# Patient Record
Sex: Male | Born: 1980 | Race: Black or African American | Hispanic: No | Marital: Single | Smoking: Former smoker
Health system: Southern US, Community
[De-identification: ages and names within clinical notes are randomized; demographics above are authoritative.]

## PROBLEM LIST (undated history)

## (undated) DIAGNOSIS — S43006A Unspecified dislocation of unspecified shoulder joint, initial encounter: Secondary | ICD-10-CM

## (undated) DIAGNOSIS — Z789 Other specified health status: Secondary | ICD-10-CM

## (undated) HISTORY — PX: ROTATOR CUFF REPAIR: SHX139

## (undated) HISTORY — PX: MANDIBLE FRACTURE SURGERY: SHX706

---

## 2005-09-08 ENCOUNTER — Emergency Department (HOSPITAL_COMMUNITY): Admission: EM | Admit: 2005-09-08 | Discharge: 2005-09-09 | Payer: Self-pay | Admitting: Emergency Medicine

## 2005-09-21 ENCOUNTER — Emergency Department (HOSPITAL_COMMUNITY): Admission: EM | Admit: 2005-09-21 | Discharge: 2005-09-21 | Payer: Self-pay | Admitting: Emergency Medicine

## 2005-10-22 ENCOUNTER — Emergency Department (HOSPITAL_COMMUNITY): Admission: EM | Admit: 2005-10-22 | Discharge: 2005-10-22 | Payer: Self-pay | Admitting: Family Medicine

## 2008-08-01 ENCOUNTER — Emergency Department (HOSPITAL_COMMUNITY): Admission: EM | Admit: 2008-08-01 | Discharge: 2008-08-01 | Payer: Self-pay | Admitting: Emergency Medicine

## 2009-04-28 ENCOUNTER — Emergency Department (HOSPITAL_COMMUNITY): Admission: EM | Admit: 2009-04-28 | Discharge: 2009-04-28 | Payer: Self-pay | Admitting: Emergency Medicine

## 2013-05-25 ENCOUNTER — Emergency Department (HOSPITAL_COMMUNITY)
Admission: EM | Admit: 2013-05-25 | Discharge: 2013-05-25 | Disposition: A | Discharge status: Home / Self Care | Payer: Self-pay | Attending: Emergency Medicine | Admitting: Emergency Medicine

## 2013-05-25 ENCOUNTER — Encounter (HOSPITAL_COMMUNITY): Payer: Self-pay | Admitting: Emergency Medicine

## 2013-05-25 DIAGNOSIS — R143 Flatulence: Secondary | ICD-10-CM

## 2013-05-25 DIAGNOSIS — R142 Eructation: Secondary | ICD-10-CM | POA: Insufficient documentation

## 2013-05-25 DIAGNOSIS — R141 Gas pain: Secondary | ICD-10-CM | POA: Insufficient documentation

## 2013-05-25 DIAGNOSIS — R197 Diarrhea, unspecified: Secondary | ICD-10-CM | POA: Insufficient documentation

## 2013-05-25 DIAGNOSIS — R109 Unspecified abdominal pain: Secondary | ICD-10-CM

## 2013-05-25 DIAGNOSIS — R1084 Generalized abdominal pain: Secondary | ICD-10-CM | POA: Insufficient documentation

## 2013-05-25 DIAGNOSIS — Z79899 Other long term (current) drug therapy: Secondary | ICD-10-CM | POA: Insufficient documentation

## 2013-05-25 LAB — URINALYSIS, ROUTINE W REFLEX MICROSCOPIC
BILIRUBIN URINE: NEGATIVE
Glucose, UA: NEGATIVE mg/dL
HGB URINE DIPSTICK: NEGATIVE
Ketones, ur: NEGATIVE mg/dL
LEUKOCYTES UA: NEGATIVE
NITRITE: NEGATIVE
PH: 7.5 (ref 5.0–8.0)
PROTEIN: NEGATIVE mg/dL
Specific Gravity, Urine: 1.011 (ref 1.005–1.030)
UROBILINOGEN UA: 0.2 mg/dL (ref 0.0–1.0)

## 2013-05-25 LAB — COMPREHENSIVE METABOLIC PANEL
ALBUMIN: 4.3 g/dL (ref 3.5–5.2)
ALK PHOS: 65 U/L (ref 39–117)
ALT: 22 U/L (ref 0–53)
AST: 20 U/L (ref 0–37)
BUN: 10 mg/dL (ref 6–23)
CALCIUM: 9.6 mg/dL (ref 8.4–10.5)
CO2: 26 meq/L (ref 19–32)
CREATININE: 0.83 mg/dL (ref 0.50–1.35)
Chloride: 101 mEq/L (ref 96–112)
GLUCOSE: 92 mg/dL (ref 70–99)
Potassium: 4 mEq/L (ref 3.7–5.3)
Sodium: 141 mEq/L (ref 137–147)
TOTAL PROTEIN: 8.1 g/dL (ref 6.0–8.3)
Total Bilirubin: 0.5 mg/dL (ref 0.3–1.2)

## 2013-05-25 LAB — CBC WITH DIFFERENTIAL/PLATELET
Basophils Absolute: 0.1 10*3/uL (ref 0.0–0.1)
Basophils Relative: 2 % — ABNORMAL HIGH (ref 0–1)
Eosinophils Absolute: 0 10*3/uL (ref 0.0–0.7)
Eosinophils Relative: 1 % (ref 0–5)
HCT: 43.6 % (ref 39.0–52.0)
Hemoglobin: 15 g/dL (ref 13.0–17.0)
LYMPHS PCT: 45 % (ref 12–46)
Lymphs Abs: 1.7 10*3/uL (ref 0.7–4.0)
MCH: 28.1 pg (ref 26.0–34.0)
MCHC: 34.4 g/dL (ref 30.0–36.0)
MCV: 81.8 fL (ref 78.0–100.0)
Monocytes Absolute: 0.3 10*3/uL (ref 0.1–1.0)
Monocytes Relative: 8 % (ref 3–12)
NEUTROS ABS: 1.7 10*3/uL (ref 1.7–7.7)
Neutrophils Relative %: 45 % (ref 43–77)
Platelets: 188 10*3/uL (ref 150–400)
RBC: 5.33 MIL/uL (ref 4.22–5.81)
RDW: 13 % (ref 11.5–15.5)
WBC: 3.7 10*3/uL — AB (ref 4.0–10.5)

## 2013-05-25 LAB — LIPASE, BLOOD: LIPASE: 22 U/L (ref 11–59)

## 2013-05-25 MED ORDER — DICYCLOMINE HCL 20 MG PO TABS: 20.0000 mg | ORAL_TABLET | Freq: Two times a day (BID) | ORAL | Status: DC

## 2013-05-25 NOTE — ED Provider Notes (Signed)
CSN: [id number]     Arrival date & time 05/25/13  1257 History   First MD Initiated Contact with Patient 05/25/13 1412     Chief Complaint  Patient presents with  . Abdominal Pain   (Consider location/radiation/quality/duration/timing/severity/associated sxs/prior Treatment) Patient is a 33 y.o. male presenting with abdominal pain. The history is provided by the patient. No language interpreter was used.  Abdominal Pain Pain location:  Generalized Associated symptoms: diarrhea   Associated symptoms: no chills, no fever, no nausea and no vomiting   Associated symptoms comment:  Mild abdominal pain that fluctuates, causes abdominal bloating and increased flatulence. He reports his stools have been watery without blood or melena. No nausea or vomiting. He denies any significant dietary changes in the last 5 months since his symptoms began.   History reviewed. No pertinent past medical history. History reviewed. No pertinent past surgical history. History reviewed. No pertinent family history. History  Substance Use Topics  . Smoking status: Not on file  . Smokeless tobacco: Not on file  . Alcohol Use: No    Review of Systems  Constitutional: Negative for fever and chills.  Respiratory: Negative.   Cardiovascular: Negative.   Gastrointestinal: Positive for abdominal pain and diarrhea. Negative for nausea, vomiting and blood in stool.  Genitourinary: Negative.   Musculoskeletal: Negative.   Skin: Negative.   Neurological: Negative.     Allergies  Shrimp  Home Medications   Current Outpatient Rx  Name  Route  Sig  Dispense  Refill  . dicyclomine (BENTYL) 20 MG tablet   Oral   Take 1 tablet (20 mg total) by mouth 2 (two) times daily.   20 tablet   0    BP 175/65  Pulse 54  Temp(Src) 97.8 F (36.6 C) (Oral)  Resp 18  Wt 192 lb 4 oz (87.204 kg)  SpO2 100% Physical Exam  Constitutional: He is oriented to person, place, and time. He appears well-developed and  well-nourished.  HENT:  Head: Normocephalic.  Neck: Normal range of motion. Neck supple.  Cardiovascular: Normal rate and regular rhythm.   Pulmonary/Chest: Effort normal and breath sounds normal.  Abdominal: Soft. Bowel sounds are normal. There is tenderness. There is no rebound and no guarding.  Very mild, diffuse tenderness.   Musculoskeletal: Normal range of motion.  Neurological: He is alert and oriented to person, place, and time.  Skin: Skin is warm and dry. No rash noted.  Psychiatric: He has a normal mood and affect.    ED Course  Procedures (including critical care time) Labs Review Labs Reviewed  CBC WITH DIFFERENTIAL - Abnormal; Notable for the following:    WBC 3.7 (*)    Basophils Relative 2 (*)    All other components within normal limits  COMPREHENSIVE METABOLIC PANEL  LIPASE, BLOOD  URINALYSIS, ROUTINE W REFLEX MICROSCOPIC   Results for orders placed during the hospital encounter of 05/25/13  CBC WITH DIFFERENTIAL      Result Value Range   WBC 3.7 (*) 4.0 - 10.5 K/uL   RBC 5.33  4.22 - 5.81 MIL/uL   Hemoglobin 15.0  13.0 - 17.0 g/dL   HCT 40.9  81.1 - 91.4 %   MCV 81.8  78.0 - 100.0 fL   MCH 28.1  26.0 - 34.0 pg   MCHC 34.4  30.0 - 36.0 g/dL   RDW 78.2  95.6 - 21.3 %   Platelets 188  150 - 400 K/uL   Neutrophils Relative % 45  43 -  77 %   Neutro Abs 1.7  1.7 - 7.7 K/uL   Lymphocytes Relative 45  12 - 46 %   Lymphs Abs 1.7  0.7 - 4.0 K/uL   Monocytes Relative 8  3 - 12 %   Monocytes Absolute 0.3  0.1 - 1.0 K/uL   Eosinophils Relative 1  0 - 5 %   Eosinophils Absolute 0.0  0.0 - 0.7 K/uL   Basophils Relative 2 (*) 0 - 1 %   Basophils Absolute 0.1  0.0 - 0.1 K/uL  COMPREHENSIVE METABOLIC PANEL      Result Value Range   Sodium 141  137 - 147 mEq/L   Potassium 4.0  3.7 - 5.3 mEq/L   Chloride 101  96 - 112 mEq/L   CO2 26  19 - 32 mEq/L   Glucose, Bld 92  70 - 99 mg/dL   BUN 10  6 - 23 mg/dL   Creatinine, Ser 1.610.83  0.50 - 1.35 mg/dL   Calcium 9.6   8.4 - 09.610.5 mg/dL   Total Protein 8.1  6.0 - 8.3 g/dL   Albumin 4.3  3.5 - 5.2 g/dL   AST 20  0 - 37 U/L   ALT 22  0 - 53 U/L   Alkaline Phosphatase 65  39 - 117 U/L   Total Bilirubin 0.5  0.3 - 1.2 mg/dL   GFR calc non Af Amer >90  >90 mL/min   GFR calc Af Amer >90  >90 mL/min  LIPASE, BLOOD      Result Value Range   Lipase 22  11 - 59 U/L  URINALYSIS, ROUTINE W REFLEX MICROSCOPIC      Result Value Range   Color, Urine YELLOW  YELLOW   APPearance CLEAR  CLEAR   Specific Gravity, Urine 1.011  1.005 - 1.030   pH 7.5  5.0 - 8.0   Glucose, UA NEGATIVE  NEGATIVE mg/dL   Hgb urine dipstick NEGATIVE  NEGATIVE   Bilirubin Urine NEGATIVE  NEGATIVE   Ketones, ur NEGATIVE  NEGATIVE mg/dL   Protein, ur NEGATIVE  NEGATIVE mg/dL   Urobilinogen, UA 0.2  0.0 - 1.0 mg/dL   Nitrite NEGATIVE  NEGATIVE   Leukocytes, UA NEGATIVE  NEGATIVE    Imaging Review No results found.  EKG Interpretation   None       MDM   1. Abdominal pain    Symptoms for 5 months, benign exam and normal blood studies are reassuring that there is no acute process. Discussed medication that might help symptoms and importance of primary care.     Arnoldo HookerShari A Shirley Bolle, PA-C 05/25/13 1508

## 2013-05-25 NOTE — Discharge Instructions (Signed)
FOLLOW UP WITH YOUR CHOICE OF PRIMARY CARE PROVIDER. TAKE MEDICATION AS PRESCRIBED. RETURN HERE WITH ANY SEVERE PAIN, HIGH FEVER, VOMITING OR BLOODY BOWEL MOVEMENT.

## 2013-05-25 NOTE — ED Notes (Signed)
Per patient request. Called [phone] to let them know that pt is in our ER. No medical information given.

## 2013-05-25 NOTE — ED Notes (Signed)
Pt reports having abd pain, cramping, bloating, intermittent diarrhea. No vomiting. No acute distress noted at triage.

## 2013-05-25 NOTE — ED Provider Notes (Signed)
Medical screening examination/treatment/procedure(s) were performed by non-physician practitioner and as supervising physician I was immediately available for consultation/collaboration.  Flint MelterElliott L Bleu Minerd, MD 05/25/13 2013

## 2013-11-20 ENCOUNTER — Emergency Department (HOSPITAL_COMMUNITY): Payer: Self-pay

## 2013-11-20 ENCOUNTER — Emergency Department (HOSPITAL_COMMUNITY)
Admission: EM | Admit: 2013-11-20 | Discharge: 2013-11-21 | Disposition: A | Discharge status: Home / Self Care | Payer: Self-pay | Attending: Emergency Medicine | Admitting: Emergency Medicine

## 2013-11-20 ENCOUNTER — Encounter (HOSPITAL_COMMUNITY): Payer: Self-pay | Admitting: Emergency Medicine

## 2013-11-20 DIAGNOSIS — S43004A Unspecified dislocation of right shoulder joint, initial encounter: Secondary | ICD-10-CM

## 2013-11-20 DIAGNOSIS — Y9367 Activity, basketball: Secondary | ICD-10-CM | POA: Insufficient documentation

## 2013-11-20 DIAGNOSIS — S43016A Anterior dislocation of unspecified humerus, initial encounter: Secondary | ICD-10-CM | POA: Insufficient documentation

## 2013-11-20 DIAGNOSIS — Y92838 Other recreation area as the place of occurrence of the external cause: Secondary | ICD-10-CM

## 2013-11-20 DIAGNOSIS — Y9239 Other specified sports and athletic area as the place of occurrence of the external cause: Secondary | ICD-10-CM | POA: Insufficient documentation

## 2013-11-20 DIAGNOSIS — X58XXXA Exposure to other specified factors, initial encounter: Secondary | ICD-10-CM | POA: Insufficient documentation

## 2013-11-20 DIAGNOSIS — F17200 Nicotine dependence, unspecified, uncomplicated: Secondary | ICD-10-CM | POA: Insufficient documentation

## 2013-11-20 HISTORY — DX: Unspecified dislocation of unspecified shoulder joint, initial encounter: S43.006A

## 2013-11-20 MED ORDER — OXYCODONE-ACETAMINOPHEN 5-325 MG PO TABS
2.0000 | ORAL_TABLET | Freq: Once | ORAL | Status: AC
  Administered 2013-11-20: 2 via ORAL
  Filled 2013-11-20: qty 2

## 2013-11-20 MED ORDER — HYDROMORPHONE HCL PF 1 MG/ML IJ SOLN
1.0000 mg | INTRAMUSCULAR | Status: DC | PRN
  Administered 2013-11-20: 1 mg via INTRAVENOUS
  Filled 2013-11-20: qty 1

## 2013-11-20 MED ORDER — HYDROMORPHONE HCL PF 1 MG/ML IJ SOLN
1.0000 mg | Freq: Once | INTRAMUSCULAR | Status: AC
  Administered 2013-11-20: 1 mg via INTRAVENOUS
  Filled 2013-11-20: qty 1

## 2013-11-20 MED ORDER — PROPOFOL 10 MG/ML IV BOLUS
0.5000 mg/kg | Freq: Once | INTRAVENOUS | Status: AC
  Administered 2013-11-20: 200 mg via INTRAVENOUS
  Filled 2013-11-20: qty 20

## 2013-11-20 MED ORDER — PROPOFOL 10 MG/ML IV BOLUS
INTRAVENOUS | Status: AC
  Filled 2013-11-20: qty 20

## 2013-11-20 NOTE — Sedation Documentation (Signed)
Propofol 30mg  IV given by MD  Pt remains awake.

## 2013-11-20 NOTE — Sedation Documentation (Signed)
Propofol 100mg  IV given by Res.

## 2013-11-20 NOTE — Discharge Instructions (Signed)
Shoulder Dislocation °Your shoulder is made up of three bones: the collar bone (clavicle); the shoulder blade (scapula), which includes the socket (glenoid cavity); and the upper arm bone (humerus). Your shoulder joint is the place where these bones meet. Strong, fibrous tissues hold these bones together (ligaments). Muscles and strong, fibrous tissues that connect the muscles to these bones (tendons) allow your arm to move through this joint. The range of motion of your shoulder joint is more extensive than most of your other joints, and the glenoid cavity is very shallow. That is the reason that your shoulder joint is one of the most unstable joints in your body. It is far more prone to dislocation than your other joints. Shoulder dislocation is when your humerus is forced out of your shoulder joint. °CAUSES °Shoulder dislocation is caused by a forceful impact on your shoulder. This impact usually is from an injury, such as a sports injury or a fall. °SYMPTOMS °Symptoms of shoulder dislocation include: °· Deformity of your shoulder. °· Intense pain. °· Inability to move your shoulder joint. °· Numbness, weakness, or tingling around your shoulder joint (your neck or down your arm). °· Bruising or swelling around your shoulder. °DIAGNOSIS °In order to diagnose a dislocated shoulder, your caregiver will perform a physical exam. Your caregiver also may have an X-ray exam done to see if you have any broken bones. Magnetic resonance imaging (MRI) is a procedure that sometimes is done to help your caregiver see any damage to the soft tissues around your shoulder, particularly your rotator cuff tendons. Additionally, your caregiver also may have electromyography done to measure the electrical discharges produced in your muscles if you have signs or symptoms of nerve damage. °TREATMENT °A shoulder dislocation is treated by placing the humerus back in the joint (reduction). Your caregiver does this either manually (closed  reduction), by moving your humerus back into the joint through manipulation, or through surgery (open reduction). When your humerus is back in place, severe pain should improve almost immediately. °You also may need to have surgery if you have a weak shoulder joint or ligaments, and you have recurring shoulder dislocations, despite rehabilitation. In rare cases, surgery is necessary if your nerves or blood vessels are damaged during the dislocation. °After your reduction, your arm will be placed in a shoulder immobilizer or sling to keep it from moving. Your caregiver will have you wear your shoulder immoblizer or sling for 3 days to 3 weeks, depending on how serious your dislocation is. When your shoulder immobilizer or sling is removed, your caregiver may prescribe physical therapy to help improve the range of motion in your shoulder joint. °HOME CARE INSTRUCTIONS  °The following measures can help to reduce pain and speed up the healing process: °· Rest your injured joint. Do not move it. Avoid activities similar to the one that caused your injury. °· Apply ice to your injured joint for the first day or two after your reduction or as directed by your caregiver. Applying ice helps to reduce inflammation and pain. °¨ Put ice in a plastic bag. °¨ Place a towel between your skin and the bag. °¨ Leave the ice on for 15-20 minutes at a time, every 2 hours while you are awake. °· Exercise your hand by squeezing a soft ball. This helps to eliminate stiffness and swelling in your hand and wrist. °· Take over-the-counter or prescription medicine for pain or discomfort as told by your caregiver. °SEEK IMMEDIATE MEDICAL CARE IF:  °· Your   shoulder immobilizer or sling becomes damaged. °· Your pain becomes worse rather than better. °· You lose feeling in your arm or hand, or they become white and cold. °MAKE SURE YOU:  °· Understand these instructions. °· Will watch your condition. °· Will get help right away if you are not  doing well or get worse. °Document Released: 01/13/2001 Document Revised: 07/13/2011 Document Reviewed: 02/08/2011 °[employer]® Patient Information ©2015 [employer]. This information is not intended to replace advice given to you by your health care provider. Make sure you discuss any questions you have with your health care provider. ° °

## 2013-11-20 NOTE — Sedation Documentation (Signed)
Shoulder reduced at this time

## 2013-11-20 NOTE — Sedation Documentation (Signed)
Additional 30mg  IV given.

## 2013-11-20 NOTE — ED Notes (Addendum)
Pt states playing basketball this evening and he dislocated his right shoulder. Pt states that he dislocated his shoulder 2 months ago. Pt shoulder does appear to have a bulge where socket should be placed. Pt denied shoulder sling, pt just wants to be put to sleep and have his shoulder placed back like last time.

## 2013-11-20 NOTE — Sedation Documentation (Signed)
Additional 20mg  propofol IV given by MD

## 2013-11-20 NOTE — Sedation Documentation (Signed)
Vital signs stable. 

## 2013-11-20 NOTE — ED Provider Notes (Signed)
CSN: [id number]     Arrival date & time 11/20/13  1949 History   First MD Initiated Contact with Patient 11/20/13 2002     Chief Complaint  Patient presents with  . Shoulder Injury    dislocation     (Consider location/radiation/quality/duration/timing/severity/associated sxs/prior Treatment) Patient is a 33 y.o. male presenting with shoulder injury.  Shoulder Injury This is a recurrent problem. The current episode started today. The problem occurs constantly. The problem has been unchanged. Pertinent negatives include no abdominal pain, chest pain, congestion, coughing or headaches. Exacerbated by: movement     Past Medical History  Diagnosis Date  . Dislocated shoulder    History reviewed. No pertinent past surgical history. History reviewed. No pertinent family history. History  Substance Use Topics  . Smoking status: Current Every Day Smoker  . Smokeless tobacco: Not on file  . Alcohol Use: No    Review of Systems  Constitutional: Negative for activity change.  HENT: Negative for congestion.   Eyes: Negative for visual disturbance.  Respiratory: Negative for cough and shortness of breath.   Cardiovascular: Negative for chest pain and leg swelling.  Gastrointestinal: Negative for abdominal pain and blood in stool.  Genitourinary: Negative for dysuria and hematuria.  Musculoskeletal: Negative for back pain.  Skin: Negative for color change.  Neurological: Negative for syncope and headaches.  Psychiatric/Behavioral: Negative for agitation.      Allergies  Shrimp  Home Medications   Prior to Admission medications   Not on File   BP 144/109  Pulse 120  Temp(Src) 98 F (36.7 C) (Oral)  Resp 16  Ht 6' (1.829 m)  Wt 210 lb (95.255 kg)  BMI 28.47 kg/m2  SpO2 99% Physical Exam  Nursing note and vitals reviewed. Constitutional: He is oriented to person, place, and time. He appears well-developed and well-nourished.  HENT:  Head: Normocephalic.  Eyes: Pupils  are equal, round, and reactive to light.  Neck: Neck supple.  Cardiovascular: Normal rate and regular rhythm.  Exam reveals no gallop and no friction rub.   No murmur heard. Pulmonary/Chest: Effort normal. No respiratory distress.  Abdominal: Soft. He exhibits no distension. There is no tenderness.  Musculoskeletal: He exhibits no edema.  Right upper extremity notable deformity. Pulses motor intact in all distal distributions. Patient noted to have sensation intact however is subjectively decreased. Painful to touch along shoulder  Neurological: He is alert and oriented to person, place, and time.  Skin: Skin is warm.  Psychiatric: He has a normal mood and affect.    ED Course  Reduction of dislocation Date/Time: 11/23/2013 2:17 AM Performed by: Clement Sayres Authorized by: Clement Sayres Consent: Verbal consent obtained. Patient understanding: patient states understanding of the procedure being performed Patient identity confirmed: verbally with patient Time out: Immediately prior to procedure a "time out" was called to verify the correct patient, procedure, equipment, support staff and site/side marked as required. Local anesthesia used: no Patient sedated: yes Sedation type: moderate (conscious) sedation Sedatives: propofol Vitals: Vital signs were monitored during sedation. Patient tolerance: Patient tolerated the procedure well with no immediate complications. Comments: See nursing notes for times of sedation    (including critical care time) Labs Review Labs Reviewed - No data to display  Imaging Review No results found.   EKG Interpretation None      Procedural sedation Performed by: Clement Sayres Consent: Verbal consent obtained. Risks and benefits: risks, benefits and alternatives were discussed Required items: required blood products, implants, devices, and special equipment  available Patient identity confirmed: arm band and provided demographic data Time  out: Immediately prior to procedure a "time out" was called to verify the correct patient, procedure, equipment, support staff and site/side marked as required.  Sedation type: moderate (conscious) sedation NPO time confirmed and considedered  Sedatives: PROPOFOL  Physician Time at Bedside: 15 minutes   Vitals: Vital signs were monitored during sedation. Cardiac Monitor, pulse oximeter Patient tolerance: Patient tolerated the procedure well with no immediate complications. Comments: Pt with uneventful recovered. Returned to pre-procedural sedation baseline   MDM   Final diagnoses:  Shoulder dislocation, right, initial encounter   33 year old male who states that he dislocated his shoulder while playing basketball. Patient states that he has had multiple shoulder dislocations and at this the fourth occurrence. Patient has been instructed to followup with orthopedic surgery and is in the process. Patient does have notable deformity and states that he has subjective decreased sensation in the extremity. Shoulder reduction procedure was performed and the patient tolerated well. Sedation was performed with propofol and a successful reduction was performed. Patient was monitored in the department and was able to tolerate by mouth well and walk without difficulty. Postreduction films were performed and interpreted and the reduction was successful. Patient was then discharged home with cautions given. Patient further instructed to followup with orthopedic surgery    Clement SayresStaci Krisanne Lich, MD 11/23/13 [phone]

## 2013-11-20 NOTE — ED Notes (Signed)
Radiology at bedside

## 2013-11-20 NOTE — Sedation Documentation (Signed)
RES at bedside 

## 2013-11-20 NOTE — ED Notes (Signed)
Patient transported to X-ray 

## 2013-11-20 NOTE — Sedation Documentation (Signed)
Propofol 10mg  IV given.  Attempting to reduce shoulder

## 2013-11-21 MED ORDER — HYDROCODONE-ACETAMINOPHEN 5-325 MG PO TABS: 2.0000 | ORAL_TABLET | ORAL | Status: DC | PRN

## 2013-11-24 NOTE — ED Provider Notes (Signed)
Medical screening examination/treatment/procedure(s) were conducted as a shared visit with non-physician practitioner(s) or resident and myself. I personally evaluated the patient during the encounter and agree with the findings.  I have personally reviewed any xrays and/ or EKG's with the provider and I agree with interpretation.  Patient presents with right shoulder pain and dislocation. Similar to previous multiple dislocations. No other significant injuries it happened prior to arrival. Patient has empty fossa and the right shoulder with tenderness with any attempted range of motion, sensation and pulses intact right upper extremity, well-appearing otherwise. Discussed risks and benefits of procedural sedation and shoulder reduction, patient wishes to proceed. Followup with orthopedics discussed. I stressed to followup with orthopedics as he likely will require surgery. Right shoulder reduction was done by the resident physician with my supervision and assistance. I was present for propofol and procedure sedation for 15 minutes.  Dg Shoulder Right  11/20/2013   CLINICAL DATA:  Dislocated right shoulder.  Injury, pain.  EXAM: RIGHT SHOULDER - 2+ VIEW  COMPARISON:  04/28/2009  FINDINGS: There is anterior right shoulder dislocation. Probable impaction Hill-Sachs injury which may be chronic. AC joint is intact.  IMPRESSION: Recurrent anterior right shoulder dislocation.   Electronically Signed   By: Charlett NoseKevin  Dover M.D.   On: 11/20/2013 21:34   Dg Shoulder Right Port  11/20/2013   CLINICAL DATA:  Status post reduction of right shoulder dislocation.  EXAM: PORTABLE RIGHT SHOULDER - 2+ VIEW  COMPARISON:  Right shoulder radiographs performed earlier today at 9:25 p.m.  FINDINGS: There has been successful reduction of the patient's right humeral head dislocation. An associated Hill-Sachs lesion is seen. There is also suggestion of a minimally displaced osseous Bankart lesion. An underlying shoulder joint effusion  is suggested.  No additional fractures are identified. The right acromioclavicular joint is unremarkable in appearance. The visualized portions of the right lung are grossly clear.  IMPRESSION: Successful reduction of right humeral head dislocation. Associated Hill-Sachs lesion seen. Suggestion of a minimally displaced osseous Bankart lesion, with an underlying shoulder joint effusion.   Electronically Signed   By: Roanna RaiderJeffery  Chang M.D.   On: 11/20/2013 23:58   Right shoulder dislocation, HillSachs lesion   Enid SkeensJoshua M Magalene Mclear, MD 11/24/13 [phone]

## 2014-04-28 ENCOUNTER — Emergency Department (HOSPITAL_COMMUNITY)
Admission: EM | Admit: 2014-04-28 | Discharge: 2014-04-28 | Discharge status: Absent without leave | Payer: Self-pay | Source: Home / Self Care

## 2014-05-04 ENCOUNTER — Emergency Department (HOSPITAL_COMMUNITY)
Admission: EM | Admit: 2014-05-04 | Discharge: 2014-05-04 | Disposition: A | Discharge status: Home / Self Care | Payer: Self-pay | Attending: Emergency Medicine | Admitting: Emergency Medicine

## 2014-05-04 ENCOUNTER — Encounter (HOSPITAL_COMMUNITY): Payer: Self-pay | Admitting: Emergency Medicine

## 2014-05-04 DIAGNOSIS — Z720 Tobacco use: Secondary | ICD-10-CM | POA: Insufficient documentation

## 2014-05-04 DIAGNOSIS — Z8781 Personal history of (healed) traumatic fracture: Secondary | ICD-10-CM | POA: Insufficient documentation

## 2014-05-04 DIAGNOSIS — N342 Other urethritis: Secondary | ICD-10-CM | POA: Insufficient documentation

## 2014-05-04 DIAGNOSIS — R361 Hematospermia: Secondary | ICD-10-CM

## 2014-05-04 MED ORDER — LIDOCAINE HCL (PF) 1 % IJ SOLN
5.0000 mL | Freq: Once | INTRAMUSCULAR | Status: AC
  Administered 2014-05-04: 5 mL
  Filled 2014-05-04: qty 5

## 2014-05-04 MED ORDER — CEFTRIAXONE SODIUM 250 MG IJ SOLR
250.0000 mg | Freq: Once | INTRAMUSCULAR | Status: AC
  Administered 2014-05-04: 250 mg via INTRAMUSCULAR
  Filled 2014-05-04: qty 250

## 2014-05-04 MED ORDER — AZITHROMYCIN 250 MG PO TABS
1000.0000 mg | ORAL_TABLET | Freq: Once | ORAL | Status: AC
  Administered 2014-05-04: 1000 mg via ORAL
  Filled 2014-05-04: qty 4

## 2014-05-04 NOTE — ED Notes (Signed)
Pt c/o blood in semen and tingling.

## 2014-05-04 NOTE — ED Provider Notes (Signed)
CSN: [id number]     Arrival date & time 05/04/14  1028 History   First MD Initiated Contact with Patient 05/04/14 1030     Chief Complaint  Patient presents with  . SEXUALLY TRANSMITTED DISEASE      HPI Patient presents with complaints of discharge from his penis and blood in his ejaculate.  Blood was noted in his ejaculate 3 days ago.  He also has had a "tingling" sensation in his penis.  He states he is married and has had no other sexual contacts.  Denies penile or testicular pain.  No other complaints.   Past Medical History  Diagnosis Date  . Dislocated shoulder    Past Surgical History  Procedure Laterality Date  . Mandible fracture surgery     No family history on file. History  Substance Use Topics  . Smoking status: Current Every Day Smoker  . Smokeless tobacco: Not on file  . Alcohol Use: No    Review of Systems  All other systems reviewed and are negative.     Allergies  Shrimp  Home Medications   Prior to Admission medications   Medication Sig Start Date End Date Taking? Authorizing Provider  HYDROcodone-acetaminophen (NORCO/VICODIN) 5-325 MG per tablet Take 2 tablets by mouth every 4 (four) hours as needed for moderate pain or severe pain. 11/21/13   Clement Sayres, MD   BP 146/92 mmHg  Pulse 65  Temp(Src) 98.3 F (36.8 C) (Oral)  Resp 18  SpO2 96% Physical Exam  Constitutional: He is oriented to person, place, and time. He appears well-developed and well-nourished.  HENT:  Head: Normocephalic.  Eyes: EOM are normal.  Neck: Normal range of motion.  Pulmonary/Chest: Effort normal.  Abdominal: He exhibits no distension.  Genitourinary:  Circumcised male.  No penile or testicular pain.  No testicular masses.  No significant discharge coming from the patient's penis at this time.  Musculoskeletal: Normal range of motion.  Neurological: He is alert and oriented to person, place, and time.  Psychiatric: He has a normal mood and affect.  Nursing note  and vitals reviewed.   ED Course  Procedures (including critical care time) Labs Review Labs Reviewed  GC/CHLAMYDIA PROBE AMP    Imaging Review No results found.   EKG Interpretation None      MDM   Final diagnoses:  Urethritis  Blood in semen    Patient be treated for urethritis.  Gonorrhea and Chlamydia swabs were sent.  Patient has been instructed to not have intercourse until all the symptoms have resolved and he has had all sexual partners seen and evaluated and potentially treated for STD.    Lyanne Co, MD 05/04/14 [phone]

## 2014-05-04 NOTE — Discharge Instructions (Signed)
Urethritis Urethritis is an inflammation of the tube through which urine exits your bladder (urethra).  CAUSES Urethritis is often caused by an infection in your urethra. The infection can be viral, like herpes. The infection can also be bacterial, like gonorrhea. RISK FACTORS Risk factors of urethritis include:  Having sex without using a condom.  Having multiple sexual partners.  Having poor hygiene. SIGNS AND SYMPTOMS Symptoms of urethritis are less noticeable in women than in men. These symptoms include:  Burning feeling when you urinate (dysuria).  Discharge from your urethra.  Blood in your urine (hematuria).  Urinating more than usual. DIAGNOSIS  To confirm a diagnosis of urethritis, your health care provider will do the following:  Ask about your sexual history.  Perform a physical exam.  Have you provide a sample of your urine for lab testing.  Use a cotton swab to gently collect a sample from your urethra for lab testing. TREATMENT  It is important to treat urethritis. Depending on the cause, untreated urethritis may lead to serious genital infections and possibly infertility. Urethritis caused by a bacterial infection is treated with antibiotic medicine. All sexual partners must be treated.  HOME CARE INSTRUCTIONS  Do not have sex until the test results are known and treatment is completed, even if your symptoms go away before you finish treatment.  If you were prescribed an antibiotic, finish it all even if you start to feel better. SEEK MEDICAL CARE IF:   Your symptoms are not improved in 3 days.  Your symptoms are getting worse.  You develop abdominal pain or pelvic pain (in women).  You develop joint pain.  You have a fever. SEEK IMMEDIATE MEDICAL CARE IF:   You have severe pain in the belly, back, or side.  You have repeated vomiting. MAKE SURE YOU:  Understand these instructions.  Will watch your condition.  Will get help right away if you  are not doing well or get worse. Document Released: 10/14/2000 Document Revised: 09/04/2013 Document Reviewed: 12/19/2012 [facility] Patient Information 2015 [place], [place]. This information is not intended to replace advice given to you by your health care provider. Make sure you discuss any questions you have with your health care provider.  Urethritis Urethritis is an inflammation of the tube through which urine exits your bladder (urethra).  CAUSES Urethritis is often caused by an infection in your urethra. The infection can be viral, like herpes. The infection can also be bacterial, like gonorrhea. RISK FACTORS Risk factors of urethritis include:  Having sex without using a condom.  Having multiple sexual partners.  Having poor hygiene. SIGNS AND SYMPTOMS Symptoms of urethritis are less noticeable in women than in men. These symptoms include:  Burning feeling when you urinate (dysuria).  Discharge from your urethra.  Blood in your urine (hematuria).  Urinating more than usual. DIAGNOSIS  To confirm a diagnosis of urethritis, your health care provider will do the following:  Ask about your sexual history.  Perform a physical exam.  Have you provide a sample of your urine for lab testing.  Use a cotton swab to gently collect a sample from your urethra for lab testing. TREATMENT  It is important to treat urethritis. Depending on the cause, untreated urethritis may lead to serious genital infections and possibly infertility. Urethritis caused by a bacterial infection is treated with antibiotic medicine. All sexual partners must be treated.  HOME CARE INSTRUCTIONS  Do not have sex until the test results are known and treatment is completed,  even if your symptoms go away before you finish treatment.  If you were prescribed an antibiotic, finish it all even if you start to feel better. SEEK MEDICAL CARE IF:   Your symptoms are not improved in 3 days.  Your symptoms are  getting worse.  You develop abdominal pain or pelvic pain (in women).  You develop joint pain.  You have a fever. SEEK IMMEDIATE MEDICAL CARE IF:   You have severe pain in the belly, back, or side.  You have repeated vomiting. MAKE SURE YOU:  Understand these instructions.  Will watch your condition.  Will get help right away if you are not doing well or get worse. Document Released: 10/14/2000 Document Revised: 09/04/2013 Document Reviewed: 12/19/2012 [facility] Patient Information 2015 [place], [place]. This information is not intended to replace advice given to you by your health care provider. Make sure you discuss any questions you have with your health care provider.

## 2014-05-09 LAB — GC/CHLAMYDIA PROBE AMP
CT PROBE, AMP APTIMA: NEGATIVE
GC PROBE AMP APTIMA: NEGATIVE

## 2014-06-02 ENCOUNTER — Emergency Department (HOSPITAL_COMMUNITY)
Admission: EM | Admit: 2014-06-02 | Discharge: 2014-06-02 | Disposition: A | Discharge status: Home / Self Care | Payer: Self-pay | Attending: Emergency Medicine | Admitting: Emergency Medicine

## 2014-06-02 ENCOUNTER — Encounter (HOSPITAL_COMMUNITY): Payer: Self-pay | Admitting: Cardiology

## 2014-06-02 DIAGNOSIS — Z720 Tobacco use: Secondary | ICD-10-CM | POA: Insufficient documentation

## 2014-06-02 DIAGNOSIS — Z87828 Personal history of other (healed) physical injury and trauma: Secondary | ICD-10-CM | POA: Insufficient documentation

## 2014-06-02 DIAGNOSIS — Z202 Contact with and (suspected) exposure to infections with a predominantly sexual mode of transmission: Secondary | ICD-10-CM | POA: Insufficient documentation

## 2014-06-02 MED ORDER — METRONIDAZOLE 500 MG PO TABS
2000.0000 mg | ORAL_TABLET | Freq: Once | ORAL | Status: AC
  Administered 2014-06-02: 2000 mg via ORAL
  Filled 2014-06-02: qty 4

## 2014-06-02 NOTE — ED Provider Notes (Signed)
CSN: [id number]     Arrival date & time 06/02/14  1123 History  This chart was scribed for Rhea Bleacher, PA-C, working with Tilden Fossa, MD by Chestine Spore, ED Scribe. The patient was seen in room TR11C/TR11C at 12:20 PM.    Chief Complaint  Patient presents with  . SEXUALLY TRANSMITTED DISEASE     The history is provided by the patient. No language interpreter was used.    HPI Comments: Alex Quinn is a 34 y.o. male who presents to the Emergency Department complaining of exposure to STD onset PTA. He reports that his partner was recently dx with Trichomoniasis and he would like to be checked at this time. He denies penile discharge, penile pain, genital lesions, testicular pain, testicular swelling, dysuria, hematuria, and any other symptoms.   Past Medical History  Diagnosis Date  . Dislocated shoulder    Past Surgical History  Procedure Laterality Date  . Mandible fracture surgery     History reviewed. No pertinent family history. History  Substance Use Topics  . Smoking status: Current Every Day Smoker    Types: Cigarettes  . Smokeless tobacco: Not on file  . Alcohol Use: No    Review of Systems  Constitutional: Negative for fever.  HENT: Negative for sore throat.   Eyes: Negative for discharge.  Gastrointestinal: Negative for rectal pain.  Genitourinary: Negative for dysuria, frequency, discharge, penile swelling, scrotal swelling, genital sores, penile pain and testicular pain.  Musculoskeletal: Negative for arthralgias.  Skin: Negative for rash.  Hematological: Negative for adenopathy.      Allergies  Shrimp  Home Medications   Prior to Admission medications   Medication Sig Start Date End Date Taking? Authorizing Provider  HYDROcodone-acetaminophen (NORCO/VICODIN) 5-325 MG per tablet Take 2 tablets by mouth every 4 (four) hours as needed for moderate pain or severe pain. 11/21/13   Clement Sayres, MD   BP 132/88 mmHg  Pulse 77  Temp(Src) 97.9 F  (36.6 C) (Oral)  Resp 18  Ht 6' (1.829 m)  Wt 237 lb (107.502 kg)  BMI 32.14 kg/m2  SpO2 99%  Physical Exam  Constitutional: He is oriented to person, place, and time. He appears well-developed and well-nourished. No distress.  HENT:  Head: Normocephalic and atraumatic.  Eyes: Conjunctivae and EOM are normal.  Neck: Normal range of motion. Neck supple. No tracheal deviation present.  Cardiovascular: Normal rate.   Pulmonary/Chest: Effort normal. No respiratory distress.  Genitourinary: Testes normal. Right testis shows no mass, no swelling and no tenderness. Left testis shows no mass, no swelling and no tenderness. Circumcised. No penile tenderness. No discharge found.  Musculoskeletal: Normal range of motion.  Neurological: He is alert and oriented to person, place, and time.  Skin: Skin is warm and dry.  Psychiatric: He has a normal mood and affect. His behavior is normal.  Nursing note and vitals reviewed.   ED Course  Procedures (including critical care time) DIAGNOSTIC STUDIES: Oxygen Saturation is 99% on room air, normal by my interpretation.    COORDINATION OF CARE: 12:22 PM-Discussed treatment plan which includes metronidazole with pt at bedside and pt agreed to plan.   Labs Review Labs Reviewed  RPR  HIV ANTIBODY (ROUTINE TESTING)  GC/CHLAMYDIA PROBE AMP ([place])    Imaging Review No results found.   EKG Interpretation None      Discussed HIV, syphilis, GC/chlamydia pending. Will be notified with positive results.   MDM   Final diagnoses:  Trichomonas exposure   Patient with  recent Trichomonas exposure. Patient is asymptomatic.  I personally performed the services described in this documentation, which was scribed in my presence. The recorded information has been reviewed and is accurate.    Renne CriglerJoshua Kearstin Learn, PA-C 06/02/14 1232  Tilden FossaElizabeth Rees, MD 06/02/14 [phone]

## 2014-06-02 NOTE — ED Notes (Signed)
Pt reports that his partner was recently dx with Alex Quinn and is concerned. Denies any symptoms at this time.

## 2014-06-02 NOTE — ED Notes (Signed)
Josh PA at bedside   

## 2014-06-02 NOTE — Discharge Instructions (Signed)
Please read and follow all provided instructions.  Your diagnoses today include:  1. Trichomonas exposure     Tests performed today include:  Test for gonorrhea, chlamydia, HIV, and syphilis. You will be notified by telephone if you have a positive result.  Vital signs. See below for your results today.   Medications:   Metronidazole - medication for trichomonas  Home care instructions:  Read educational materials contained in this packet and follow any instructions provided.   You should tell your partners about your infection and avoid having sex for one week to allow time for the medicine to work.  STD Testing:  [facility] [place] [place]  [address], [place], phone [phone] or [phone]    Monday - Friday, call for an appointment  [facility] [place] [place] [facility]  [address], [place], phone [phone] or [phone]   Monday - Friday, call for an appointment  Return instructions:   Please return to the Emergency Department if you experience worsening symptoms.   Please return if you have any other emergent concerns.  Additional Information:  Your vital signs today were: BP 132/88 mmHg   Pulse 77   Temp(Src) 97.9 F (36.6 C) (Oral)   Resp 18   Ht 6' (1.829 m)   Wt 237 lb (107.502 kg)   BMI 32.14 kg/m2   SpO2 99% If your blood pressure (BP) was elevated above 135/85 this visit, please have this repeated by your doctor within one month. --------------

## 2014-06-04 LAB — GC/CHLAMYDIA PROBE AMP (~~LOC~~) NOT AT ARMC
Chlamydia: NEGATIVE
NEISSERIA GONORRHEA: NEGATIVE

## 2014-06-05 LAB — HIV ANTIBODY (ROUTINE TESTING W REFLEX): HIV Screen 4th Generation wRfx: NONREACTIVE

## 2014-06-05 LAB — RPR: RPR: NONREACTIVE

## 2014-06-11 ENCOUNTER — Encounter (HOSPITAL_COMMUNITY): Payer: Self-pay | Admitting: Emergency Medicine

## 2014-06-11 ENCOUNTER — Emergency Department (HOSPITAL_COMMUNITY)
Admission: EM | Admit: 2014-06-11 | Discharge: 2014-06-11 | Disposition: A | Discharge status: Home / Self Care | Payer: Self-pay | Attending: Emergency Medicine | Admitting: Emergency Medicine

## 2014-06-11 DIAGNOSIS — Y848 Other medical procedures as the cause of abnormal reaction of the patient, or of later complication, without mention of misadventure at the time of the procedure: Secondary | ICD-10-CM | POA: Insufficient documentation

## 2014-06-11 DIAGNOSIS — M79631 Pain in right forearm: Secondary | ICD-10-CM | POA: Insufficient documentation

## 2014-06-11 DIAGNOSIS — T148XXA Other injury of unspecified body region, initial encounter: Secondary | ICD-10-CM

## 2014-06-11 DIAGNOSIS — Z720 Tobacco use: Secondary | ICD-10-CM | POA: Insufficient documentation

## 2014-06-11 DIAGNOSIS — Z87828 Personal history of other (healed) physical injury and trauma: Secondary | ICD-10-CM | POA: Insufficient documentation

## 2014-06-11 DIAGNOSIS — L7622 Postprocedural hemorrhage and hematoma of skin and subcutaneous tissue following other procedure: Secondary | ICD-10-CM | POA: Insufficient documentation

## 2014-06-11 MED ORDER — IBUPROFEN 800 MG PO TABS
800.0000 mg | ORAL_TABLET | Freq: Once | ORAL | Status: AC
  Administered 2014-06-11: 800 mg via ORAL
  Filled 2014-06-11: qty 1

## 2014-06-11 MED ORDER — IBUPROFEN 800 MG PO TABS
800.0000 mg | ORAL_TABLET | Freq: Three times a day (TID) | ORAL | Status: DC
Start: 2014-06-11 — End: 2015-06-06

## 2014-06-11 NOTE — ED Provider Notes (Signed)
CSN: [id number]     Arrival date & time 06/11/14  7829 History   First MD Initiated Contact with Patient 06/11/14 0801     Chief Complaint  Patient presents with  . Arm Pain     (Consider location/radiation/quality/duration/timing/severity/associated sxs/prior Treatment) Patient is a 34 y.o. male presenting with arm injury. The history is provided by the patient. No language interpreter was used.  Arm Injury Location:  Elbow and arm Time since incident:  1 week Injury: yes   Mechanism of injury comment:  IV access 06/03/14 Arm location:  R forearm Elbow location:  R elbow Pain details:    Quality:  Aching   Radiates to:  Does not radiate   Severity:  Moderate   Onset quality:  Gradual   Duration:  1 week   Timing:  Constant   Progression:  Unchanged Chronicity:  New Handedness:  Right-handed Dislocation: no   Foreign body present:  No foreign bodies Tetanus status:  Up to date Prior injury to area:  No Relieved by:  Nothing Worsened by:  Nothing tried Ineffective treatments:  None tried Associated symptoms: swelling   Associated symptoms: no back pain, no decreased range of motion, no fever and no numbness   Associated symptoms comment:  Ecchymosis   Past Medical History  Diagnosis Date  . Dislocated shoulder    Past Surgical History  Procedure Laterality Date  . Mandible fracture surgery     No family history on file. History  Substance Use Topics  . Smoking status: Current Every Day Smoker    Types: Cigarettes  . Smokeless tobacco: Not on file  . Alcohol Use: No    Review of Systems  Constitutional: Negative for fever and chills.  HENT: Negative for congestion, drooling and ear pain.   Respiratory: Negative for cough and shortness of breath.   Cardiovascular: Negative for leg swelling.  Gastrointestinal: Negative for nausea, vomiting and abdominal pain.  Genitourinary: Negative for dysuria, urgency and frequency.  Musculoskeletal: Negative for back  pain, joint swelling and neck stiffness.  Skin: Positive for color change (ecchymosis of the R forearm). Negative for pallor, rash and wound.  All other systems reviewed and are negative.     Allergies  Shrimp  Home Medications   Prior to Admission medications   Medication Sig Start Date End Date Taking? Authorizing Provider  HYDROcodone-acetaminophen (NORCO/VICODIN) 5-325 MG per tablet Take 2 tablets by mouth every 4 (four) hours as needed for moderate pain or severe pain. 11/21/13   Clement Sayres, MD  ibuprofen (ADVIL,MOTRIN) 800 MG tablet Take 1 tablet (800 mg total) by mouth 3 (three) times daily. 06/11/14   Bethann Berkshire, MD   BP 130/75 mmHg  Pulse 66  Temp(Src) 98 F (36.7 C) (Oral)  Resp 17  Ht 6' (1.829 m)  Wt 230 lb (104.327 kg)  BMI 31.19 kg/m2  SpO2 100% Physical Exam  Constitutional: He is oriented to person, place, and time. He appears well-developed and well-nourished. No distress.  HENT:  Head: Normocephalic and atraumatic.  Eyes: Pupils are equal, round, and reactive to light.  Neck: Normal range of motion.  Cardiovascular: Normal rate, regular rhythm and normal heart sounds.   Pulmonary/Chest: Effort normal and breath sounds normal. No respiratory distress. He has no decreased breath sounds. He has no wheezes. He has no rhonchi. He has no rales.  Abdominal: Soft. He exhibits no distension. There is no tenderness. There is no rebound and no guarding.  Musculoskeletal: He exhibits no edema.  Right forearm: He exhibits tenderness and swelling. He exhibits no bony tenderness, no edema, no deformity and no laceration.  Ecchymosis to the medial and lateral aspects of the R forearm.  Sensation intact to the fingers of the R hand.  Cap refill <3 seconds.  Soft compartments.    Neurological: He is alert and oriented to person, place, and time. He exhibits normal muscle tone.  Skin: Skin is warm and dry.  Nursing note and vitals reviewed.   ED Course  Procedures  (including critical care time) Labs Review Labs Reviewed - No data to display  Imaging Review No results found.   EKG Interpretation None      MDM   Final diagnoses:  Hematoma   Patient is a 34 year old African-American male with no pertinent past medical history who comes to the emergency department today with pain and swelling to his right AC and upper forearm. Patient received a blood draw approximately a week ago. Since then he has had ecchymosis, swelling, and pain to the R forearm. He has no pain distal to the injury. He states he does have some tingling overlying the area of the contusion. Patient has soft compartments, normal cap refill, and good sensation distal to the injury. As a result I doubt a compartment syndrome. Patient is likely suffering a hematoma from IV access. He has not been utilizing anything for the pain. He was instructed to elevate the arm above the level of the heart during the day. He was instructed to utilize Motrin for pain and to utilize warm packs to assist with pain. Patient expressed understanding. He was instructed to return to the emergency department with pain in the hand or any other concerns. Patient was discharged in good condition. His care was discussed with my attending Dr. Micheline Mazeocherty.       Bethann BerkshireAaron Ketara Cavness, MD 06/11/14 [id number]  Toy CookeyMegan Docherty, MD 06/11/14 [phone]

## 2014-06-11 NOTE — Discharge Instructions (Signed)
Contusion °A contusion is a deep bruise. Contusions are the result of an injury that caused bleeding under the skin. The contusion may turn blue, purple, or yellow. Minor injuries will give you a painless contusion, but more severe contusions may stay painful and swollen for a few weeks.  °CAUSES  °A contusion is usually caused by a blow, trauma, or direct force to an area of the body. °SYMPTOMS  °· Swelling and redness of the injured area. °· Bruising of the injured area. °· Tenderness and soreness of the injured area. °· Pain. °DIAGNOSIS  °The diagnosis can be made by taking a history and physical exam. An X-ray, CT scan, or MRI may be needed to determine if there were any associated injuries, such as fractures. °TREATMENT  °Specific treatment will depend on what area of the body was injured. In general, the best treatment for a contusion is resting, icing, elevating, and applying cold compresses to the injured area. Over-the-counter medicines may also be recommended for pain control. Ask your caregiver what the best treatment is for your contusion. °HOME CARE INSTRUCTIONS  °· Put ice on the injured area. °¨ Put ice in a plastic bag. °¨ Place a towel between your skin and the bag. °¨ Leave the ice on for 15-20 minutes, 3-4 times a day, or as directed by your health care provider. °· Only take over-the-counter or prescription medicines for pain, discomfort, or fever as directed by your caregiver. Your caregiver may recommend avoiding anti-inflammatory medicines (aspirin, ibuprofen, and naproxen) for 48 hours because these medicines may increase bruising. °· Rest the injured area. °· If possible, elevate the injured area to reduce swelling. °SEEK IMMEDIATE MEDICAL CARE IF:  °· You have increased bruising or swelling. °· You have pain that is getting worse. °· Your swelling or pain is not relieved with medicines. °MAKE SURE YOU:  °· Understand these instructions. °· Will watch your condition. °· Will get help right  away if you are not doing well or get worse. °Document Released: 01/28/2005 Document Revised: 04/25/2013 Document Reviewed: 02/23/2011 °[employer]® Patient Information ©2015 [employer]. This information is not intended to replace advice given to you by your health care provider. Make sure you discuss any questions you have with your health care provider. ° °

## 2014-06-11 NOTE — ED Notes (Signed)
Pt states he was here on 06/03/14 and had blood drawn and now states he has pain in his right arm worse when he bends his elbow. bruising is present on right forearm.

## 2014-10-12 ENCOUNTER — Emergency Department (HOSPITAL_COMMUNITY): Payer: Self-pay

## 2014-10-12 ENCOUNTER — Encounter (HOSPITAL_COMMUNITY): Payer: Self-pay | Admitting: Emergency Medicine

## 2014-10-12 ENCOUNTER — Emergency Department (HOSPITAL_COMMUNITY)
Admission: EM | Admit: 2014-10-12 | Discharge: 2014-10-12 | Disposition: A | Discharge status: Home / Self Care | Payer: Self-pay | Attending: Emergency Medicine | Admitting: Emergency Medicine

## 2014-10-12 DIAGNOSIS — Z720 Tobacco use: Secondary | ICD-10-CM | POA: Insufficient documentation

## 2014-10-12 DIAGNOSIS — Y999 Unspecified external cause status: Secondary | ICD-10-CM | POA: Insufficient documentation

## 2014-10-12 DIAGNOSIS — X58XXXA Exposure to other specified factors, initial encounter: Secondary | ICD-10-CM | POA: Insufficient documentation

## 2014-10-12 DIAGNOSIS — S43004A Unspecified dislocation of right shoulder joint, initial encounter: Secondary | ICD-10-CM

## 2014-10-12 DIAGNOSIS — Y929 Unspecified place or not applicable: Secondary | ICD-10-CM | POA: Insufficient documentation

## 2014-10-12 DIAGNOSIS — Y9384 Activity, sleeping: Secondary | ICD-10-CM | POA: Insufficient documentation

## 2014-10-12 DIAGNOSIS — Z87828 Personal history of other (healed) physical injury and trauma: Secondary | ICD-10-CM | POA: Insufficient documentation

## 2014-10-12 DIAGNOSIS — S43014A Anterior dislocation of right humerus, initial encounter: Secondary | ICD-10-CM | POA: Insufficient documentation

## 2014-10-12 DIAGNOSIS — S43034A Inferior dislocation of right humerus, initial encounter: Secondary | ICD-10-CM | POA: Insufficient documentation

## 2014-10-12 MED ORDER — PROPOFOL 10 MG/ML IV BOLUS
0.5000 mg/kg | Freq: Once | INTRAVENOUS | Status: AC
  Administered 2014-10-12: 130 mg via INTRAVENOUS
  Filled 2014-10-12: qty 20

## 2014-10-12 MED ORDER — PROPOFOL 10 MG/ML IV BOLUS
INTRAVENOUS | Status: AC | PRN
  Administered 2014-10-12: 130 mg via INTRAVENOUS

## 2014-10-12 MED ORDER — HYDROCODONE-ACETAMINOPHEN 5-325 MG PO TABS: 1.0000 | ORAL_TABLET | ORAL | Status: DC | PRN

## 2014-10-12 NOTE — ED Notes (Addendum)
Pt presents with R shoulder dislocation that occurred while he was sleeping 15 mins ago. Pt has had this happen multiple times. Pulses present. sts hand is starting to go numb

## 2014-10-12 NOTE — ED Notes (Signed)
Pt stable, ambulatory, states understanding of discharge instructions 

## 2014-10-12 NOTE — Discharge Instructions (Signed)
Shoulder Dislocation  Your shoulder is made up of three bones: the collar bone (clavicle); the shoulder blade (scapula), which includes the socket (glenoid cavity); and the upper arm bone (humerus). Your shoulder joint is the place where these bones meet. Strong, fibrous tissues hold these bones together (ligaments). Muscles and strong, fibrous tissues that connect the muscles to these bones (tendons) allow your arm to move through this joint. The range of motion of your shoulder joint is more extensive than most of your other joints, and the glenoid cavity is very shallow. That is the reason that your shoulder joint is one of the most unstable joints in your body. It is far more prone to dislocation than your other joints. Shoulder dislocation is when your humerus is forced out of your shoulder joint.  CAUSES  Shoulder dislocation is caused by a forceful impact on your shoulder. This impact usually is from an injury, such as a sports injury or a fall.  SYMPTOMS  Symptoms of shoulder dislocation include:  · Deformity of your shoulder.  · Intense pain.  · Inability to move your shoulder joint.  · Numbness, weakness, or tingling around your shoulder joint (your neck or down your arm).  · Bruising or swelling around your shoulder.  DIAGNOSIS  In order to diagnose a dislocated shoulder, your caregiver will perform a physical exam. Your caregiver also may have an X-ray exam done to see if you have any broken bones. Magnetic resonance imaging (MRI) is a procedure that sometimes is done to help your caregiver see any damage to the soft tissues around your shoulder, particularly your rotator cuff tendons. Additionally, your caregiver also may have electromyography done to measure the electrical discharges produced in your muscles if you have signs or symptoms of nerve damage.  TREATMENT  A shoulder dislocation is treated by placing the humerus back in the joint (reduction). Your caregiver does this either manually (closed  reduction), by moving your humerus back into the joint through manipulation, or through surgery (open reduction). When your humerus is back in place, severe pain should improve almost immediately.  You also may need to have surgery if you have a weak shoulder joint or ligaments, and you have recurring shoulder dislocations, despite rehabilitation. In rare cases, surgery is necessary if your nerves or blood vessels are damaged during the dislocation.  After your reduction, your arm will be placed in a shoulder immobilizer or sling to keep it from moving. Your caregiver will have you wear your shoulder immobilizer or sling for 3 days to 3 weeks, depending on how serious your dislocation is. When your shoulder immobilizer or sling is removed, your caregiver may prescribe physical therapy to help improve the range of motion in your shoulder joint.  HOME CARE INSTRUCTIONS   The following measures can help to reduce pain and speed up the healing process:  · Rest your injured joint. Do not move it. Avoid activities similar to the one that caused your injury.  · Apply ice to your injured joint for the first day or two after your reduction or as directed by your caregiver. Applying ice helps to reduce inflammation and pain.  ¨ Put ice in a plastic bag.  ¨ Place a towel between your skin and the bag.  ¨ Leave the ice on for 15-20 minutes at a time, every 2 hours while you are awake.  · Exercise your hand by squeezing a soft ball. This helps to eliminate stiffness and swelling in your hand and   wrist.  · Take over-the-counter or prescription medicine for pain or discomfort as told by your caregiver.  SEEK IMMEDIATE MEDICAL CARE IF:   · Your shoulder immobilizer or sling becomes damaged.  · Your pain becomes worse rather than better.  · You lose feeling in your arm or hand, or they become white and cold.  MAKE SURE YOU:   · Understand these instructions.  · Will watch your condition.  · Will get help right away if you are not  doing well or get worse.  Document Released: 01/13/2001 Document Revised: 09/04/2013 Document Reviewed: 02/08/2011  [employer]® Patient Information ©2015 [employer]. This information is not intended to replace advice given to you by your health care provider. Make sure you discuss any questions you have with your health care provider.

## 2014-10-12 NOTE — ED Provider Notes (Signed)
CSN: [id number]     Arrival date & time 10/12/14  0214 History  This chart was scribed for Loren Racer, MD by Bronson Curb, ED Scribe. This patient was seen in room A11C/A11C and the patient's care was started at 5:12 AM.   Chief Complaint  Patient presents with  . Shoulder Injury    The history is provided by the patient. No language interpreter was used.     HPI Comments: Alex Quinn is a 34 y.o. male who presents to the Emergency Department complaining of dislocated right shoulder. Occurred while sleeping. Significant other states this happened multiple times in the past. Patient complaining of some numbness to the right arm. Denies any injury. Patient is verbally aggressive and hostile with staff. Not forthcoming with information regarding history. Last ate several hours ago but no definite time given.   Past Medical History  Diagnosis Date  . Dislocated shoulder    Past Surgical History  Procedure Laterality Date  . Mandible fracture surgery     No family history on file. History  Substance Use Topics  . Smoking status: Current Every Day Smoker    Types: Cigarettes  . Smokeless tobacco: Not on file  . Alcohol Use: No    Review of Systems  Musculoskeletal: Positive for arthralgias.  Skin: Negative for wound.  Neurological: Positive for numbness. Negative for weakness.  All other systems reviewed and are negative.     Allergies  Shrimp  Home Medications   Prior to Admission medications   Medication Sig Start Date End Date Taking? Authorizing Provider  HYDROcodone-acetaminophen (NORCO/VICODIN) 5-325 MG per tablet Take 1 tablet by mouth every 4 (four) hours as needed for moderate pain or severe pain. 10/12/14   Loren Racer, MD  ibuprofen (ADVIL,MOTRIN) 800 MG tablet Take 1 tablet (800 mg total) by mouth 3 (three) times daily. Patient not taking: Reported on 10/12/2014 06/11/14   Bethann Berkshire, MD   Triage Vitals: BP 116/83 mmHg  Pulse 58  Temp(Src)  97.4 F (36.3 C) (Oral)  Resp 20  Ht 6' (1.829 m)  Wt 210 lb (95.255 kg)  BMI 28.47 kg/m2  SpO2 100%  Physical Exam  Constitutional: He is oriented to person, place, and time. He appears well-developed and well-nourished. No distress.  HENT:  Head: Normocephalic and atraumatic.  Mouth/Throat: Oropharynx is clear and moist.  Eyes: EOM are normal. Pupils are equal, round, and reactive to light.  Neck: Normal range of motion. Neck supple.  Cardiovascular: Normal rate and regular rhythm.   Pulmonary/Chest: Effort normal and breath sounds normal. No respiratory distress. He has no wheezes. He has no rales.  Abdominal: Soft. Bowel sounds are normal.  Musculoskeletal: He exhibits no edema or tenderness.  Patient with obvious deformity to the right shoulder. He is not allowing examination at this point. Patient does have distal pulses.  Neurological: He is alert and oriented to person, place, and time.  Limited exam due to patient cooperation. Subjectively decreased sensation to light touch over the right forearm.   Skin: Skin is warm and dry. No rash noted. No erythema.  Psychiatric: He has a normal mood and affect. His behavior is normal.  Nursing note and vitals reviewed.   ED Course  Reduction of dislocation Date/Time: 10/12/2014 4:24 AM Performed by: Loren Racer Authorized by: Loren Racer Consent: Verbal consent obtained. Written consent obtained. Consent given by: patient Patient identity confirmed: verbally with patient and arm band Time out: Immediately prior to procedure a "time out" was called to  verify the correct patient, procedure, equipment, support staff and site/side marked as required. Patient sedated: yes Sedatives: propofol Sedation start date/time: 10/12/2014 4:24 AM Sedation end date/time: 10/12/2014 4:30 AM Vitals: Vital signs were monitored during sedation. Patient tolerance: Patient tolerated the procedure well with no immediate complications Comments:  Patient placed in shoulder immobilizer.   (including critical care time)  DIAGNOSTIC STUDIES: Oxygen Saturation is 100% on room air, normal by my interpretation.    COORDINATION OF CARE:   Labs Review Labs Reviewed - No data to display  Imaging Review Dg Shoulder Right  10/12/2014   CLINICAL DATA:  Recurrent RIGHT shoulder dislocation while sleeping 15 minutes ago.  EXAM: RIGHT SHOULDER - 2+ VIEW  COMPARISON:  RIGHT shoulder radiograph November 28, 2013  FINDINGS: Anterior inferior RIGHT shoulder dislocation. Stable concavity of the humeral head consistent remote Hill-Sachs deformity. No acute fracture deformity. No destructive bony lesions. Soft tissue planes are nonsuspicious.  IMPRESSION: Anterior inferior shoulder dislocation.  Chronic Hill-Sachs deformity.   Electronically Signed   By: Awilda Metro M.D.   On: 10/12/2014 03:05   Dg Shoulder Right Port  10/12/2014   CLINICAL DATA:  Post reduction  EXAM: PORTABLE RIGHT SHOULDER - 2+ VIEW  COMPARISON:  10/12/2014 at 2:51  FINDINGS: Two portable views of the right shoulder confirm successful reduction of the glenohumeral dislocation. Hill-Sachs deformity again noted. No acute fracture is evident.  IMPRESSION: Successful reduction   Electronically Signed   By: Ellery Plunk M.D.   On: 10/12/2014 05:08     EKG Interpretation None      MDM   Final diagnoses:  Shoulder dislocation, right, initial encounter    I personally performed the services described in this documentation, which was scribed in my presence. The recorded information has been reviewed and is accurate.  Security had to be called due to patient aggression for not receiving immediate pain medication on arrival. After discussion explaining delay patient appears more calm. Will get consent for procedural sedation and shoulder reduction. Shoulder successfully reduced in the emergency department. Placed in shoulder immobilizer. Patient advised to follow-up with  orthopedist. Return precautions given.  Loren Racer, MD 10/12/14 [phone]

## 2014-10-12 NOTE — Progress Notes (Signed)
Orthopedic Tech Progress Note Patient Details:  Alex Quinn 1980/05/29 [id number]  Ortho Devices Type of Ortho Device: Shoulder immobilizer Ortho Device/Splint Location: rue Ortho Device/Splint Interventions: Application As ordered by Dr. Jason Coop, Rayme Bui 10/12/2014, 4:33 AM

## 2015-06-06 ENCOUNTER — Emergency Department (HOSPITAL_COMMUNITY): Payer: Self-pay

## 2015-06-06 ENCOUNTER — Emergency Department (HOSPITAL_COMMUNITY)
Admission: EM | Admit: 2015-06-06 | Discharge: 2015-06-06 | Disposition: A | Discharge status: Home / Self Care | Payer: Self-pay | Attending: Emergency Medicine | Admitting: Emergency Medicine

## 2015-06-06 ENCOUNTER — Encounter (HOSPITAL_COMMUNITY): Payer: Self-pay | Admitting: Emergency Medicine

## 2015-06-06 DIAGNOSIS — F17210 Nicotine dependence, cigarettes, uncomplicated: Secondary | ICD-10-CM | POA: Insufficient documentation

## 2015-06-06 DIAGNOSIS — J40 Bronchitis, not specified as acute or chronic: Secondary | ICD-10-CM | POA: Insufficient documentation

## 2015-06-06 DIAGNOSIS — Z87828 Personal history of other (healed) physical injury and trauma: Secondary | ICD-10-CM | POA: Insufficient documentation

## 2015-06-06 LAB — CBC WITH DIFFERENTIAL/PLATELET
BASOS PCT: 1 %
Basophils Absolute: 0 10*3/uL (ref 0.0–0.1)
Eosinophils Absolute: 0.5 10*3/uL (ref 0.0–0.7)
Eosinophils Relative: 8 %
HCT: 39 % (ref 39.0–52.0)
HEMOGLOBIN: 13.1 g/dL (ref 13.0–17.0)
LYMPHS ABS: 2 10*3/uL (ref 0.7–4.0)
Lymphocytes Relative: 32 %
MCH: 28.1 pg (ref 26.0–34.0)
MCHC: 33.6 g/dL (ref 30.0–36.0)
MCV: 83.5 fL (ref 78.0–100.0)
Monocytes Absolute: 0.6 10*3/uL (ref 0.1–1.0)
Monocytes Relative: 10 %
NEUTROS ABS: 3.1 10*3/uL (ref 1.7–7.7)
NEUTROS PCT: 49 %
Platelets: 155 10*3/uL (ref 150–400)
RBC: 4.67 MIL/uL (ref 4.22–5.81)
RDW: 13.3 % (ref 11.5–15.5)
WBC: 6.2 10*3/uL (ref 4.0–10.5)

## 2015-06-06 LAB — BASIC METABOLIC PANEL
Anion gap: 7 (ref 5–15)
BUN: 11 mg/dL (ref 6–20)
CO2: 26 mmol/L (ref 22–32)
Calcium: 8.8 mg/dL — ABNORMAL LOW (ref 8.9–10.3)
Chloride: 108 mmol/L (ref 101–111)
Creatinine, Ser: 0.95 mg/dL (ref 0.61–1.24)
GFR calc Af Amer: >60 mL/min (ref >60)
Glucose, Bld: 94 mg/dL (ref 65–99)
POTASSIUM: 3.8 mmol/L (ref 3.5–5.1)
Sodium: 141 mmol/L (ref 135–145)

## 2015-06-06 MED ORDER — ALBUTEROL SULFATE HFA 108 (90 BASE) MCG/ACT IN AERS
2.0000 | INHALATION_SPRAY | Freq: Once | RESPIRATORY_TRACT | Status: AC
  Administered 2015-06-06: 2 via RESPIRATORY_TRACT
  Filled 2015-06-06: qty 6.7

## 2015-06-06 MED ORDER — HYDROCODONE-ACETAMINOPHEN 5-325 MG PO TABS
1.0000 | ORAL_TABLET | Freq: Once | ORAL | Status: AC
  Administered 2015-06-06: 1 via ORAL
  Filled 2015-06-06: qty 1

## 2015-06-06 MED ORDER — PREDNISONE 20 MG PO TABS
60.0000 mg | ORAL_TABLET | Freq: Once | ORAL | Status: AC
  Administered 2015-06-06: 60 mg via ORAL
  Filled 2015-06-06: qty 3

## 2015-06-06 MED ORDER — IPRATROPIUM-ALBUTEROL 0.5-2.5 (3) MG/3ML IN SOLN
3.0000 mL | Freq: Once | RESPIRATORY_TRACT | Status: AC
  Administered 2015-06-06: 3 mL via RESPIRATORY_TRACT
  Filled 2015-06-06: qty 3

## 2015-06-06 MED ORDER — HYDROCODONE-ACETAMINOPHEN 5-325 MG PO TABS: ORAL_TABLET | ORAL | Status: DC

## 2015-06-06 MED ORDER — PREDNISONE 20 MG PO TABS: 40.0000 mg | ORAL_TABLET | Freq: Every day | ORAL | Status: DC

## 2015-06-06 NOTE — ED Notes (Signed)
Per PA, this nurse is to print work note for 1 week

## 2015-06-06 NOTE — ED Notes (Signed)
Phlebotomist at bedside.

## 2015-06-06 NOTE — ED Notes (Signed)
Pt states he has had a productive cough, headache, chills, and general weakness for the past 2 to 3 days  Fells worse today

## 2015-06-06 NOTE — Discharge Instructions (Signed)
Return to the emergency room for any worsening or concerning symptoms including fast breathing, heart racing, confusion, vomiting. ° °Rest, cover your mouth when you cough and wash your hands frequently.  ° °Push fluids: water or Gatorade, do not drink any soda, juice or caffeinated beverages. ° °For fever and pain control you can take Motrin (ibuprofen) as follows: 400 mg (this is normally 2 over the counter pills) every 4 hours with food. ° °Do not return to work until a day after your fever breaks.  ° °Take Vicodin for cough and pain control, do not drink alcohol, drive, care for children or do other critical tasks while taking Vicodin    ° °Do not hesitate to return to the emergency room for any new, worsening or concerning symptoms. ° °Please obtain primary care using resource guide below. Let them know that you were seen in the emergency room and that they will need to obtain records for further outpatient management. ° ° ° °Emergency Department Resource Guide °1) Find a Doctor and Pay Out of Pocket °Although you won't have to find out who is covered by your insurance plan, it is a good idea to ask around and get recommendations. You will then need to call the office and see if the doctor you have chosen will accept you as a new patient and what types of options they offer for patients who are self-pay. Some doctors offer discounts or will set up payment plans for their patients who do not have insurance, but you will need to ask so you aren't surprised when you get to your appointment. ° °2) Contact Your Local Health Department °Not all health departments have doctors that can see patients for sick visits, but many do, so it is worth a call to see if yours does. If you don't know where your local health department is, you can check in your phone book. The CDC also has a tool to help you locate your state's health department, and many state websites also have listings of all of their local health  departments. ° °3) Find a Walk-in Clinic °If your illness is not likely to be very severe or complicated, you may want to try a walk in clinic. These are popping up all over the country in pharmacies, drugstores, and shopping centers. They're usually staffed by nurse practitioners or physician assistants that have been trained to treat common illnesses and complaints. They're usually fairly quick and inexpensive. However, if you have serious medical issues or chronic medical problems, these are probably not your best option. ° °No Primary Care Doctor: °- Call [facility] at  832-8000 - they can help you locate a primary care doctor that  accepts your insurance, provides certain services, etc. °- Physician Referral Service- 1-800-533-3463 ° °Chronic Pain Problems: °Organization         Address  Phone   Notes  ° [facility]  (336) 297-2271 Patients need to be referred by their primary care doctor.  ° °Medication Assistance: °Organization         Address  Phone   Notes  °[place] [facility] [address] °[place], [place] [address] (336) 641-8030 --Must be a resident of [place] °-- Must have NO insurance coverage whatsoever (no Medicaid/ Medicare, etc.) °-- The pt. MUST have a primary care doctor that directs their care regularly and follows them in the community °  °[facility]  (866) 331-1348   °[employer]  (888) 892-1162   ° °  Agencies that provide inexpensive medical care: °Organization         Address  Phone   Notes  °[facility]  (336) 832-8035   °[facility]    (336) 832-7272   °[facility] [address] °[place], [place] [address] (336) 832-4777   °[facility] [address], °[place] (336) 271-4999   °[facility]    (336) 373-0678   °[facility]    (336) 272-1050   °[facility] ° [address], [place] Phone:  (336)  832-4444, Fax:  (336) 832-4440 Hours of Operation:  9 am - 6 pm, M-F.  Also accepts Medicaid/Medicare and self-pay.  °[facility] ° [address], [place] Phone: (336) 832-3150, Fax: (336) 832-3151. Hours of Operation:  8:30 am - 5:30 pm, M-F.  Also accepts Medicaid and self-pay.  °[facility] [place] [address], [place] Phone: (336) 878-6027   °[facility] [address], [place], [place] (336)723-1848, Ext. 123 Mondays & Thursdays: 7-9 AM.  First 15 patients are seen on a first come, first serve basis. °  ° °Medicaid-accepting [place] Providers: ° °Organization         Address  Phone   Notes  °[facility] [address], [place] (336) 641-2100 Also accepts self-pay patients.  °[facility] [address], [place] ° (336) 856-9996   °[facility] [address], [place] (336) 288-8857   °[facility] [address] [place] [address], [place] (336) 299-7000   °Veita Bland [address], [place]  ° (336) 373-1557 Only accepts [place] Access Medicaid patients after they have their name applied to their card.  ° °Self-Pay (no insurance) in [place]: ° °Organization         Address  Phone   Notes  °Sickle Cell Patients, [facility] [address], [place] (336) 832-1970   °[facility] [address], [place] (336) 832-4400   °[facility] ° [address], [place] (336) 992-4800   °[facility]/Dr. Osei-Bonsu ° [address] [place] [address], [place] or [address], [place] (336) 841-8500 Phone number for both [place] and [place] locations is the same.  °[facility] [address], [place] (336) 299-0000   °[facility] [address] [place] [address], [place] or [address] (336)  852-7530 °(336) 878-2260   °[facility] [address], [place] (336) 350-1642, phone; (336) 294-5005, fax Sees patients 1st and 3rd Saturday of every month.  Must not qualify for public or private insurance (i.e. Medicaid, Medicare, [place] Health Choice, Veterans' Benefits) • Household income should be no more than 200% of the poverty level •The clinic cannot treat you if you are pregnant or think you are pregnant • Sexually transmitted diseases are not treated at the clinic.  ° ° °Dental Care: °Organization         Address  Phone  Notes  °[facility] [address], [place] (336) 641-6152 Accepts children up to age 21 who are enrolled in Medicaid or [place] Health Choice; pregnant women with a Medicaid card; and children who have applied for Medicaid or   [place] Health Choice, but were declined, whose parents can pay a reduced fee at time of service.  °[facility] [place]  [address], [place] (336) 641-7733 Accepts children up to age 21 who are enrolled in Medicaid or [place] Health Choice; pregnant women with a Medicaid card; and children who have applied for Medicaid or [place] Health Choice, but were declined, whose parents can pay a reduced fee at time of service.  °[facility] ° [address], [place] (336) 641-4533 Patients are seen by appointment only. Walk-ins are not accepted. [facility] will see patients 18 years of age and older. °Monday - Tuesday (8am-5pm) °Most Wednesdays (8:30-5pm) °$30 per visit, cash only  °[facility] ° [address], [place] (336) 641-4533 Patients are seen by appointment only. Walk-ins are not accepted. [facility] will see patients 18 years of age and older. °One Wednesday Evening (Monthly: Volunteer Based).  $30 per visit, cash only  °[facility]  (919) 537-3737 for adults;  Children under age 4, call Graduate Pediatric Dentistry at (919) 537-3956. Children aged 4-14, please call (919) 537-3737 to request a pediatric application. ° Dental services are provided in all areas of dental care including fillings, crowns and bridges, complete and partial dentures, implants, gum treatment, root canals, and extractions. Preventive care is also provided. Treatment is provided to both adults and children. °Patients are selected via a lottery and there is often a waiting list. °  °[facility] [address], °[place] ° (336) 763-8833 [email or web address] °  °[facility] [address], [place], [place] (336)723-1848, Ext. 123 Second and Fourth Thursday of each month, opens at 6:30 AM; Clinic ends at 9 AM.  Patients are seen on a first-come first-served basis, and a limited number are seen during each clinic.  ° °[facility] ° [address], [place], [place] (336) 723-7904   Eligibility Requirements °You must have lived in [place], [place], or [place] counties for at least the last three months. °  You cannot be eligible for state or federal sponsored healthcare insurance, including Veterans Administration, Medicaid, or Medicare. °  You generally cannot be eligible for healthcare insurance through your employer.  °  How to apply: °Eligibility screenings are held every Tuesday and Wednesday afternoon from 1:00 pm until 4:00 pm. You do not need an appointment for the interview!  °[facility] [address], [place], [place] 336-631-2330   °[facility]  336-342-8273   °[facility]  336-703-3100   °[facility]  336-570-6415   ° °Behavioral Health Resources in the Community: °Intensive Outpatient Programs °Organization         Address  Phone  Notes  °[facility] [address], [place], [place] 336-878-6098   °[facility] [address], [place], [place] 336-832-9800   °[facility] [address], [place], [place] ° 336-882-2125   °[facility] [address],  °[place], [place] 1-800-853-5163 or 336-641-4981   °Substance Abuse Resources °Organization         Address  Phone  Notes  °[facility]  336-882-2125   °[facility]  336-784-9470   °[facility]  336-285-9073   °[facility]  336-845-3988   °Residential & Outpatient Substance Abuse Program  1-800-659-3381   °  Psychological Services °Organization         Address  Phone  Notes  °[facility]  336- 832-9600   °[facility]  336- 378-7881   °[facility] [address], [place] 1-800-853-5163 or 336-641-4981   ° °Mobile Crisis Teams °Organization         Address  Phone  Notes  °[facility], Mobile Crisis Care Unit  1-877-626-1772   °Assertive °[facility] ° [address] [place], [place] 336-834-9664   °Sharon DeEsch [address] °[place] 336-554-5454   ° °Self-Help/Support Groups °Organization         Address  Phone             Notes  °[facility] - variety of support groups  336- 373-1402 Call for more information  °Narcotics Anonymous (NA), [facility] [address], °[place]  2 meetings at this location  ° °Residential Treatment Programs °Organization         Address  Phone  Notes  °[facility] [address],    °[place]  1-866-801-8205   °[facility] ° [address], [place], [place] 704-293-8524   °[facility] [address], [place] 336-845-3988 Admissions: 8am-3pm M-F  °[facility] [address]    °[place], [place] 336-841-1104   °[facility] [address], [place], [place] 336-379-7146   °[facility] [address]  °[place], [place] 336-285-9073   °[facility] - Intensive  Outpatient [address], [place], [place] 336-852-3033   °[facility] [address]  °[place], [place] 1-877-615-2722 or 336-784-9470   °[facility] [address] [place], [place] 336-227-7417 Accepts Medicaid  °[facility] [address]  °[place] 1-800-659-3381 Substance Abuse/Addiction Treatment  ° °[place] Behavioral Health Resources °Organization         Address  Phone  Notes  °[facility]  (888) 581-9988   °Julie Brannon, PhD [address] [place], [place]   (336) 349-5553 or (336) 951-0000   °[facility]   [address] °[place], [place] (336) 349-4454   °[facility] [address], [place], [place] (336) 342-8316 Insurance/Medicaid/sponsorship through [facility]  °[facility] [address]                                    [place], [place] (336) 342-8316 Therapy/tele-psych/case  °[facility] [address]  ° [place], [place] (336) 349-2233    °Dr. Arfeen  (336) 349-4544   °[facility] [place]  [employer] [place] [facility] 1) [address], [place] °2) [address], [place] °3)  [address], [place] (336) 349-3220 °(336) 342-7768 ° °(336) 342-8140   °[place] [facility] (336) 342-1394 or (336) 342-3537 (After Hours)    ° ° ° ° °

## 2015-06-06 NOTE — ED Provider Notes (Signed)
CSN: [id number]     Arrival date & time 06/06/15  2059 History   First MD Initiated Contact with Patient 06/06/15 2135     Chief Complaint  Patient presents with  . Cough  . Headache  . Chills     (Consider location/radiation/quality/duration/timing/severity/associated sxs/prior Treatment) HPI   Blood pressure 120/78, pulse 80, temperature 97.7 F (36.5 C), temperature source Oral, resp. rate 20, SpO2 99 %.  Alex Quinn is a 35 y.o. male complaining of productive cough, headache, chills and generalized fatigue with chest congestion, tightness and shortness of breath worsening over the course of 2 days. Denies fever, vomiting, change in bowel or bladder habits, cervicalgia, rash, sick contacts. Patient does not get a flu shot this year.   Past Medical History  Diagnosis Date  . Dislocated shoulder    Past Surgical History  Procedure Laterality Date  . Mandible fracture surgery    . Rotator cuff repair     History reviewed. No pertinent family history. Social History  Substance Use Topics  . Smoking status: Current Every Day Smoker    Types: Cigarettes  . Smokeless tobacco: None  . Alcohol Use: No    Review of Systems  10 systems reviewed and found to be negative, except as noted in the HPI.  Allergies  Shrimp  Home Medications   Prior to Admission medications   Medication Sig Start Date End Date Taking? Authorizing Provider  naproxen sodium (ANAPROX) 220 MG tablet Take 220 mg by mouth 2 (two) times daily as needed (headache).   Yes Historical Provider, MD  Pseudoeph-Doxylamine-DM-APAP (NYQUIL PO) Take 30 mLs by mouth daily as needed (cold symptoms).   Yes Historical Provider, MD  HYDROcodone-acetaminophen (NORCO/VICODIN) 5-325 MG per tablet Take 1 tablet by mouth every 4 (four) hours as needed for moderate pain or severe pain. Patient not taking: Reported on 06/06/2015 10/12/14   Loren Racer, MD  HYDROcodone-acetaminophen (NORCO/VICODIN) 5-325 MG tablet Take  1-2 tablets by mouth every 6 hours as needed for pain. 06/06/15   Nataley Bahri, PA-C  ibuprofen (ADVIL,MOTRIN) 800 MG tablet Take 1 tablet (800 mg total) by mouth 3 (three) times daily. Patient not taking: Reported on 10/12/2014 06/11/14   Bethann Berkshire, MD  predniSONE (DELTASONE) 20 MG tablet Take 2 tablets (40 mg total) by mouth daily. 06/06/15   Benay Pomeroy, PA-C   BP 108/79 mmHg  Pulse 57  Temp(Src) 97.7 F (36.5 C) (Oral)  Resp 18  SpO2 94% Physical Exam  Constitutional: He appears well-developed and well-nourished.  HENT:  Head: Normocephalic.  Right Ear: External ear normal.  Left Ear: External ear normal.  Mouth/Throat: Oropharynx is clear and moist. No oropharyngeal exudate.  No drooling or stridor. Posterior pharynx mildly erythematous no significant tonsillar hypertrophy. No exudate. Soft palate rises symmetrically. No TTP or induration under tongue.   No tenderness to palpation of frontal or bilateral maxillary sinuses.  Mild mucosal edema in the nares with scant rhinorrhea.  Bilateral tympanic membranes with normal architecture and good light reflex.    Eyes: Conjunctivae and EOM are normal. Pupils are equal, round, and reactive to light.  Neck: Normal range of motion. Neck supple.  Cardiovascular: Normal rate and regular rhythm.   Pulmonary/Chest: Effort normal. No stridor. No respiratory distress. He has wheezes. He has no rales. He exhibits no tenderness.  Trace scattered expiratory wheezing  Abdominal: Soft. There is no tenderness. There is no rebound and no guarding.  Nursing note and vitals reviewed.   ED Course  Procedures (including critical care time) Labs Review Labs Reviewed  BASIC METABOLIC PANEL - Abnormal; Notable for the following:    Calcium 8.8 (*)    All other components within normal limits  CBC WITH DIFFERENTIAL/PLATELET    Imaging Review Dg Chest 2 View  06/06/2015  CLINICAL DATA:  Productive cough, headache and chills for 2 days.  EXAM: CHEST  2 VIEW COMPARISON:  None. FINDINGS: The cardiomediastinal contours are normal. The lungs are clear. Pulmonary vasculature is normal. No consolidation, pleural effusion, or pneumothorax. No acute osseous abnormalities are seen. IMPRESSION: No acute pulmonary process. Electronically Signed   By: Rubye Oaks M.D.   On: 06/06/2015 21:58   I have personally reviewed and evaluated these images and lab results as part of my medical decision-making.   EKG Interpretation None      MDM   Final diagnoses:  Bronchitis    Filed Vitals:   06/06/15 2103 06/06/15 2201 06/06/15 2300  BP: 120/78 108/73 108/79  Pulse: 80 60 57  Temp: 97.7 F (36.5 C)    TempSrc: Oral    Resp: 20  18  SpO2: 99% 100% 94%    Medications  predniSONE (DELTASONE) tablet 60 mg (60 mg Oral Given 06/06/15 2158)  ipratropium-albuterol (DUONEB) 0.5-2.5 (3) MG/3ML nebulizer solution 3 mL (3 mLs Nebulization Given 06/06/15 2158)  HYDROcodone-acetaminophen (NORCO/VICODIN) 5-325 MG per tablet 1 tablet (1 tablet Oral Given 06/06/15 2158)  albuterol (PROVENTIL HFA;VENTOLIN HFA) 108 (90 Base) MCG/ACT inhaler 2 puff (2 puffs Inhalation Given 06/06/15 2326)    Alex Quinn is 35 y.o. male presenting with cough, chest tightness, shortness of breath. Patient is wheezing slightly on my exam. Chest x-ray is clear, blood work reassuring, likely influenza-like illness versus viral syndrome. Reassured patient and will send him home with prednisone burst and inhaler in addition to cough medication.   Evaluation does not show pathology that would require ongoing emergent intervention or inpatient treatment. Pt is hemodynamically stable and mentating appropriately. Discussed findings and plan with patient/guardian, who agrees with care plan. All questions answered. Return precautions discussed and outpatient follow up given.   New Prescriptions   HYDROCODONE-ACETAMINOPHEN (NORCO/VICODIN) 5-325 MG TABLET    Take 1-2 tablets by  mouth every 6 hours as needed for pain.   PREDNISONE (DELTASONE) 20 MG TABLET    Take 2 tablets (40 mg total) by mouth daily.         Wynetta Emery, PA-C 06/06/15 4098  Gerhard Munch, MD 06/06/15 [phone]

## 2015-07-01 ENCOUNTER — Emergency Department (HOSPITAL_COMMUNITY)
Admission: EM | Admit: 2015-07-01 | Discharge: 2015-07-01 | Disposition: A | Discharge status: Home / Self Care | Payer: Self-pay

## 2015-07-01 NOTE — ED Notes (Signed)
Called for triage multiple times with no response 

## 2016-06-07 ENCOUNTER — Encounter (HOSPITAL_COMMUNITY): Payer: Self-pay

## 2016-06-07 ENCOUNTER — Emergency Department (HOSPITAL_COMMUNITY): Payer: Self-pay

## 2016-06-07 ENCOUNTER — Emergency Department (HOSPITAL_COMMUNITY)
Admission: EM | Admit: 2016-06-07 | Discharge: 2016-06-07 | Disposition: A | Discharge status: Home / Self Care | Payer: Self-pay | Attending: Emergency Medicine | Admitting: Emergency Medicine

## 2016-06-07 DIAGNOSIS — Z79899 Other long term (current) drug therapy: Secondary | ICD-10-CM | POA: Insufficient documentation

## 2016-06-07 DIAGNOSIS — S43004A Unspecified dislocation of right shoulder joint, initial encounter: Secondary | ICD-10-CM | POA: Insufficient documentation

## 2016-06-07 DIAGNOSIS — F17210 Nicotine dependence, cigarettes, uncomplicated: Secondary | ICD-10-CM | POA: Insufficient documentation

## 2016-06-07 DIAGNOSIS — Y9389 Activity, other specified: Secondary | ICD-10-CM | POA: Insufficient documentation

## 2016-06-07 DIAGNOSIS — Y990 Civilian activity done for income or pay: Secondary | ICD-10-CM | POA: Insufficient documentation

## 2016-06-07 DIAGNOSIS — X500XXA Overexertion from strenuous movement or load, initial encounter: Secondary | ICD-10-CM | POA: Insufficient documentation

## 2016-06-07 DIAGNOSIS — Y929 Unspecified place or not applicable: Secondary | ICD-10-CM | POA: Insufficient documentation

## 2016-06-07 MED ORDER — LIDOCAINE HCL (PF) 1 % IJ SOLN: 30.0000 mL | Freq: Once | INTRAMUSCULAR | Status: DC

## 2016-06-07 MED ORDER — HYDROCODONE-ACETAMINOPHEN 5-325 MG PO TABS: 1.0000 | ORAL_TABLET | ORAL | 0 refills | Status: DC | PRN

## 2016-06-07 MED ORDER — PROPOFOL 10 MG/ML IV BOLUS
INTRAVENOUS | Status: DC | PRN
  Administered 2016-06-07: 60 ug via INTRAVENOUS

## 2016-06-07 MED ORDER — PROPOFOL 1000 MG/100ML IV EMUL
5.0000 ug/kg/min | Freq: Once | INTRAVENOUS | Status: DC
  Filled 2016-06-07: qty 100

## 2016-06-07 MED ORDER — PROPOFOL 10 MG/ML IV BOLUS
INTRAVENOUS | Status: DC | PRN
  Administered 2016-06-07: 40 mg via INTRAVENOUS

## 2016-06-07 MED ORDER — HYDROMORPHONE HCL 2 MG/ML IJ SOLN
1.0000 mg | Freq: Once | INTRAMUSCULAR | Status: AC
  Administered 2016-06-07: 1 mg via INTRAVENOUS
  Filled 2016-06-07: qty 1

## 2016-06-07 MED ORDER — OXYCODONE-ACETAMINOPHEN 5-325 MG PO TABS
ORAL_TABLET | ORAL | Status: AC
  Filled 2016-06-07: qty 1

## 2016-06-07 MED ORDER — OXYCODONE-ACETAMINOPHEN 5-325 MG PO TABS
1.0000 | ORAL_TABLET | Freq: Once | ORAL | Status: AC
Start: 2016-06-07 — End: 2016-06-07
  Administered 2016-06-07: 1 via ORAL

## 2016-06-07 MED ORDER — SODIUM CHLORIDE 0.9 % IV BOLUS (SEPSIS)
1000.0000 mL | Freq: Once | INTRAVENOUS | Status: AC
  Administered 2016-06-07: 1000 mL via INTRAVENOUS

## 2016-06-07 MED ORDER — PROPOFOL 10 MG/ML IV BOLUS
INTRAVENOUS | Status: DC | PRN
  Administered 2016-06-07: 80 ug via INTRAVENOUS

## 2016-06-07 MED ORDER — KETOROLAC TROMETHAMINE 15 MG/ML IJ SOLN
15.0000 mg | Freq: Once | INTRAMUSCULAR | Status: AC
  Administered 2016-06-07: 15 mg via INTRAVENOUS
  Filled 2016-06-07: qty 1

## 2016-06-07 MED ORDER — PROPOFOL 10 MG/ML IV BOLUS
INTRAVENOUS | Status: DC | PRN
  Administered 2016-06-07: 40 ug via INTRAVENOUS

## 2016-06-07 NOTE — ED Triage Notes (Signed)
Patient states that he thinks he has dislocated his right shoulder when lifting something at work today, pain with any ROM. Has hx of previous dislocation. Positive distal pulses

## 2016-06-07 NOTE — ED Notes (Addendum)
Sling immobilizer placed on pt's right arm, per Dr. Erma HeritageIsaacs.

## 2016-06-07 NOTE — ED Notes (Signed)
Patient transported to X-ray 

## 2016-06-07 NOTE — ED Notes (Signed)
Pt returned from X-ray.  

## 2016-06-07 NOTE — ED Provider Notes (Signed)
[facility]-EMERGENCY DEPT Provider Note   CSN: [id number] Arrival date & time: 06/07/16  1723     History   Chief Complaint Chief Complaint  Patient presents with  . Shoulder Injury    HPI Alex Quinn is a 36 y.o. male.  HPI   36 yo M with PMHx of recurrent dislocation of right shoulder here with shoulder pain. History limited due to pt agitation, demands for pain meds. He states that he was working today when he started to lift something up from below. He felt an immediate onset of severe right shoulder pain and deformity. Pain is similar to his previous dislocations. No direct trauma. Pain is stabbing, sharp. He has tingling throughout his "whole arm." He is right hand dominant.  Past Medical History:  Diagnosis Date  . Dislocated shoulder     There are no active problems to display for this patient.   Past Surgical History:  Procedure Laterality Date  . MANDIBLE FRACTURE SURGERY    . ROTATOR CUFF REPAIR         Home Medications    Prior to Admission medications   Medication Sig Start Date End Date Taking? Authorizing Provider  HYDROcodone-acetaminophen (NORCO/VICODIN) 5-325 MG tablet Take 1-2 tablets by mouth every 4 (four) hours as needed. 06/07/16   Shaune Pollack, MD  naproxen sodium (ANAPROX) 220 MG tablet Take 220 mg by mouth 2 (two) times daily as needed (headache).    Historical Provider, MD  predniSONE (DELTASONE) 20 MG tablet Take 2 tablets (40 mg total) by mouth daily. 06/06/15   Nicole Pisciotta, PA-C  Pseudoeph-Doxylamine-DM-APAP (NYQUIL PO) Take 30 mLs by mouth daily as needed (cold symptoms).    Historical Provider, MD    Family History No family history on file.  Social History Social History  Substance Use Topics  . Smoking status: Current Every Day Smoker    Types: Cigarettes  . Smokeless tobacco: Not on file  . Alcohol use No     Allergies   Shrimp [shellfish allergy]   Review of Systems Review of Systems  Constitutional: Negative for  chills and fever.  Respiratory: Negative for shortness of breath.   Cardiovascular: Negative for chest pain.  Musculoskeletal: Positive for arthralgias. Negative for neck pain.  Skin: Negative for rash and wound.  Allergic/Immunologic: Negative for immunocompromised state.  Neurological: Negative for weakness and numbness.  Hematological: Does not bruise/bleed easily.     Physical Exam Updated Vital Signs BP 141/80 (BP Location: Right Arm)   Pulse 80   Temp 98.5 F (36.9 C) (Oral)   Resp 22   Ht 6' (1.829 m)   Wt 193 lb (87.5 kg)   SpO2 100%   BMI 26.18 kg/m   Physical Exam  Constitutional: He is oriented to person, place, and time. He appears well-developed and well-nourished. He appears distressed.  HENT:  Head: Normocephalic and atraumatic.  Eyes: Conjunctivae are normal.  Neck: Neck supple.  Cardiovascular: Normal rate, regular rhythm and normal heart sounds.   Pulmonary/Chest: Effort normal. No respiratory distress. He has no wheezes.  Abdominal: He exhibits no distension.  Musculoskeletal: He exhibits no edema.  Neurological: He is alert and oriented to person, place, and time. He exhibits normal muscle tone.  Skin: Skin is warm. Capillary refill takes less than 2 seconds. No rash noted.  Nursing note and vitals reviewed.    UPPER EXTREMITY EXAM: RIGHT  INSPECTION & PALPATION: Deformity right shoulder with exquisite TTP and limitation of ROM. No open wounds. No bruising.  SENSORY: Sensation is intact to light touch in:  Superficial radial nerve distribution (dorsal first web space) Median nerve distribution (tip of index finger)   Ulnar nerve distribution (tip of small finger)     Diminished along: Axillary nerve distribution (lateral aspect of shoulder)  MOTOR:  + Motor posterior interosseous nerve (thumb IP extension) + Anterior interosseous nerve (thumb IP flexion, index finger DIP flexion) + Radial nerve (wrist extension) + Median nerve (palpable  firing thenar mass) + Ulnar nerve (palpable firing of first dorsal interosseous muscle)  VASCULAR: 2+ radial pulse Brisk capillary refill < 2 sec, fingers warm and well-perfused  COMPARTMENTS: Soft, warm, well-perfused No pain with passive extension No paresthesias    ED Treatments / Results  Labs (all labs ordered are listed, but only abnormal results are displayed) Labs Reviewed - No data to display  EKG  EKG Interpretation None       Radiology Dg Shoulder Right  Result Date: 06/07/2016 CLINICAL DATA:  Patient states that he thinks he has dislocated his right shoulder when lifting something at work today, pain with any ROM. Has hx of multiple dislocations of right shoulder. No surgery. EXAM: RIGHT SHOULDER - 2+ VIEW COMPARISON:  10/12/2014 FINDINGS: There is anterior dislocation of the right shoulder, associated with a Hill-Sachs deformity. No other fractures are identified. The right lung apex is clear. IMPRESSION: Anterior dislocation of the right shoulder. Electronically Signed   By: Norva Pavlov M.D.   On: 06/07/2016 18:20   Dg Shoulder Right Port  Result Date: 06/07/2016 CLINICAL DATA:  Post reduction EXAM: PORTABLE RIGHT SHOULDER COMPARISON:  06/07/2016 FINDINGS: Interim reduction of previously noted anterior shoulder dislocation. No gross fracture. Moderate Hill-Sachs deformity of the right humeral head. IMPRESSION: Reduction of right shoulder dislocation.  No obvious fracture Electronically Signed   By: Jasmine Pang M.D.   On: 06/07/2016 20:55    Procedures ORTHOPEDIC INJURY TREATMENT Date/Time: 06/08/2016 12:50 AM Performed by: Shaune Pollack Authorized by: Shaune Pollack  Consent: Written consent obtained. Risks and benefits: risks, benefits and alternatives were discussed Consent given by: patient Patient understanding: patient states understanding of the procedure being performed Patient consent: the patient's understanding of the procedure matches  consent given Procedure consent: procedure consent matches procedure scheduled Relevant documents: relevant documents present and verified Test results: test results available and properly labeled Site marked: the operative site was marked Imaging studies: imaging studies available Required items: required blood products, implants, devices, and special equipment available Patient identity confirmed: arm band Time out: Immediately prior to procedure a "time out" was called to verify the correct patient, procedure, equipment, support staff and site/side marked as required. Injury location: upper arm Location details: right upper arm Injury type: dislocation Pre-procedure distal perfusion: normal Pre-procedure neurological function: diminished Pre-procedure range of motion: reduced  Anesthesia: Local anesthesia used: no  Sedation: Patient sedated: yes Sedatives: propofol Analgesia: hydromorphone Vitals: Vital signs were monitored during sedation. Manipulation performed: yes Reduction successful: yes X-ray confirmed reduction: yes Immobilization: sling Post-procedure neurovascular assessment: post-procedure neurovascularly intact Post-procedure distal perfusion: normal Post-procedure neurological function: normal Post-procedure range of motion: improved Patient tolerance: Patient tolerated the procedure well with no immediate complications    (including critical care time)  Medications Ordered in ED Medications  oxyCODONE-acetaminophen (PERCOCET/ROXICET) 5-325 MG per tablet 1 tablet (1 tablet Oral Given 06/07/16 1740)  HYDROmorphone (DILAUDID) injection 1 mg (1 mg Intravenous Given 06/07/16 1911)  sodium chloride 0.9 % bolus 1,000 mL (0 mLs Intravenous Stopped 06/07/16 2117)  ketorolac (TORADOL)  15 MG/ML injection 15 mg (15 mg Intravenous Given 06/07/16 1928)     Initial Impression / Assessment and Plan / ED Course  I have reviewed the triage vital signs and the nursing  notes.  Pertinent labs & imaging results that were available during my care of the patient were reviewed by me and considered in my medical decision making (see chart for details).     36 yo M with h/o recurrent R shoulder dislocations here with atraumatic R shoulder dislocation. Exam as above, plain films confirm anterior dislocation. Pt sedated, shoulder reduced as above with gentle traction and external rotation. Post-reduction films show good alignment, no fx. Pt does report tingling along axillary nerve distribution that is improved post-reduction. Will place in sling, advise Ortho f/u. I discussed my concerns and the risks of recurrent dislocation and reiterated importance of follow-up.  Final Clinical Impressions(s) / ED Diagnoses   Final diagnoses:  Shoulder dislocation, right, initial encounter    New Prescriptions Discharge Medication List as of 06/07/2016  9:16 PM       Shaune Pollackameron Tanelle Lanzo, MD 06/08/16 [phone]

## 2016-11-21 ENCOUNTER — Emergency Department (HOSPITAL_COMMUNITY)
Admission: EM | Admit: 2016-11-21 | Discharge: 2016-11-21 | Disposition: A | Discharge status: Home / Self Care | Payer: Self-pay | Attending: Emergency Medicine | Admitting: Emergency Medicine

## 2016-11-21 ENCOUNTER — Encounter (HOSPITAL_COMMUNITY): Payer: Self-pay | Admitting: Emergency Medicine

## 2016-11-21 DIAGNOSIS — F17210 Nicotine dependence, cigarettes, uncomplicated: Secondary | ICD-10-CM | POA: Insufficient documentation

## 2016-11-21 DIAGNOSIS — M25511 Pain in right shoulder: Secondary | ICD-10-CM | POA: Insufficient documentation

## 2016-11-21 MED ORDER — IBUPROFEN 600 MG PO TABS: 600.0000 mg | ORAL_TABLET | Freq: Four times a day (QID) | ORAL | 0 refills | Status: DC | PRN

## 2016-11-21 MED ORDER — CYCLOBENZAPRINE HCL 10 MG PO TABS: 10.0000 mg | ORAL_TABLET | Freq: Two times a day (BID) | ORAL | 0 refills | Status: DC | PRN

## 2016-11-21 NOTE — Discharge Instructions (Signed)
Wear sling for support.  Take ibuprofen and flexeril as needed for pain.  Follow up with orthopedist for further management of your recurrent shoulder dislocation.  Return to the ER if your condition worsen or if you have other concerns.

## 2016-11-21 NOTE — ED Provider Notes (Signed)
[facility]-EMERG[facility]Y DEPT Provider Note   CSN: [id number] Arrival date & time: 11/21/16  1449     History   Chief Complaint Chief Complaint  Patient presents with  . Arm Injury    HPI Alex Quinn is a 36 y.o. male.  HPI   36 year old male with history of recurrent shoulder dislocation presenting with shoulder pain. Patient states he possibly dislocated his right shoulder today after he was pushing on a TV. He noted immediate sharp pain to the right shoulder, non radiating, moderate in severity.  He knew that he may have dislocated it. However he was able to manipulate it back in. He knows he will be in pain for the next several days as he has had similar symptoms like this in the past. He is here requesting for a sling only. He also requesting for a work note. He denies any new numbness, denies any neck pain or chest pain or elbow pain. The last time that he dislocated his shoulder with several months prior. He is waiting for his insurance to kick in so he can get his shoulder repaired.  Past Medical History:  Diagnosis Date  . Dislocated shoulder     There are no active problems to display for this patient.   Past Surgical History:  Procedure Laterality Date  . MANDIBLE FRACTURE SURGERY    . ROTATOR CUFF REPAIR         Home Medications    Prior to Admission medications   Medication Sig Start Date End Date Taking? Authorizing Provider  HYDROcodone-acetaminophen (NORCO/VICODIN) 5-325 MG tablet Take 1-2 tablets by mouth every 4 (four) hours as needed. 06/07/16   Shaune Pollack, MD  naproxen sodium (ANAPROX) 220 MG tablet Take 220 mg by mouth 2 (two) times daily as needed (headache).    [provider]  predniSONE (DELTASONE) 20 MG tablet Take 2 tablets (40 mg total) by mouth daily. 06/06/15   Pisciotta, Joni Reining, PA-C  Pseudoeph-Doxylamine-DM-APAP (NYQUIL PO) Take 30 mLs by mouth daily as needed (cold symptoms).    [provider]    Family History No family  history on file.  Social History Social History  Substance Use Topics  . Smoking status: Current Every Day Smoker    Types: Cigarettes  . Smokeless tobacco: Never Used  . Alcohol use No     Allergies   Shrimp [shellfish allergy]   Review of Systems Review of Systems  Constitutional: Negative for fever.  Musculoskeletal: Positive for arthralgias.  Skin: Negative for rash and wound.  Neurological: Negative for numbness.     Physical Exam Updated Vital Signs BP (!) 157/100 (BP Location: Left Arm)   Pulse 70   Temp 98.4 F (36.9 C) (Oral)   Resp 18   SpO2 97%   Physical Exam  Constitutional: He is oriented to person, place, and time. He appears well-developed and well-nourished. No distress.  HENT:  Head: Atraumatic.  Eyes: Conjunctivae are normal.  Neck: Neck supple.  Musculoskeletal: He exhibits tenderness (R shoulder: tenderness to lateral deltoid and at [facility] joint with intact ROM, no gross deformity, no overlying skin changes or swelling noted.  R elbow normal).  Neurological: He is alert and oriented to person, place, and time.  Skin: No rash noted.  Psychiatric: He has a normal mood and affect.  Nursing note and vitals reviewed.    ED Treatments / Results  Labs (all labs ordered are listed, but only abnormal results are displayed) Labs Reviewed - No data to display  EKG  EKG Interpretation None       Radiology No results found.  Procedures Procedures (including critical care time)  Medications Ordered in ED Medications - No data to display   Initial Impression / Assessment and Plan / ED Course  I have reviewed the triage vital signs and the nursing notes.  Pertinent labs & imaging results that were available during my care of the patient were reviewed by me and considered in my medical decision making (see chart for details).     BP (!) 157/100 (BP Location: Left Arm)   Pulse 70   Temp 98.4 F (36.9 C) (Oral)   Resp 18   SpO2 97%     Final Clinical Impressions(s) / ED Diagnoses   Final diagnoses:  Acute pain of right shoulder    New Prescriptions New Prescriptions   CYCLOBENZAPRINE (FLEXERIL) 10 MG TABLET    Take 1 tablet (10 mg total) by mouth 2 (two) times daily as needed for muscle spasms.   IBUPROFEN (ADVIL,MOTRIN) 600 MG TABLET    Take 1 tablet (600 mg total) by mouth every 6 (six) hours as needed.   3:56 PM Pt with hx of recurrent R shoulder dislocation.  He may have dislocated but able to relocate his R shoulder PTA.  He is NVI.  R shoulder appears to be in place.  Sling provided for comfort.  Will d/c with NSAIDs and work note.  Return precaution given.  Ortho referral given as needed.    Fayrene Helperran, Eun Vermeer, PA-C 11/21/16 1601    Arby BarrettePfeiffer, Marcy, MD 11/21/16 [phone]

## 2016-11-21 NOTE — ED Triage Notes (Signed)
Pt reports that he dislocated his right shoulder today by lifting. Pt reports history of same and that he fixed his shoulder. Pt refuse xray at this time reports "just want a sling to rest it up for a while."

## 2016-11-21 NOTE — ED Notes (Signed)
Declined W/C at D/C and was escorted to lobby by RN. 

## 2016-12-27 ENCOUNTER — Encounter (HOSPITAL_COMMUNITY): Payer: Self-pay | Admitting: Nurse Practitioner

## 2016-12-27 ENCOUNTER — Emergency Department (HOSPITAL_COMMUNITY)
Admission: EM | Admit: 2016-12-27 | Discharge: 2016-12-27 | Disposition: A | Discharge status: Home / Self Care | Payer: Self-pay | Attending: Emergency Medicine | Admitting: Emergency Medicine

## 2016-12-27 DIAGNOSIS — Z87891 Personal history of nicotine dependence: Secondary | ICD-10-CM | POA: Insufficient documentation

## 2016-12-27 DIAGNOSIS — Z202 Contact with and (suspected) exposure to infections with a predominantly sexual mode of transmission: Secondary | ICD-10-CM | POA: Insufficient documentation

## 2016-12-27 LAB — URINALYSIS, ROUTINE W REFLEX MICROSCOPIC
Bacteria, UA: NONE SEEN
Bilirubin Urine: NEGATIVE
GLUCOSE, UA: NEGATIVE mg/dL
Hgb urine dipstick: NEGATIVE
KETONES UR: NEGATIVE mg/dL
Nitrite: NEGATIVE
PH: 7 (ref 5.0–8.0)
Protein, ur: NEGATIVE mg/dL
SPECIFIC GRAVITY, URINE: 1.008 (ref 1.005–1.030)

## 2016-12-27 MED ORDER — CEFTRIAXONE SODIUM 250 MG IJ SOLR
250.0000 mg | Freq: Once | INTRAMUSCULAR | Status: AC
  Administered 2016-12-27: 250 mg via INTRAMUSCULAR
  Filled 2016-12-27: qty 250

## 2016-12-27 MED ORDER — AZITHROMYCIN 250 MG PO TABS
1000.0000 mg | ORAL_TABLET | Freq: Once | ORAL | Status: AC
  Administered 2016-12-27: 1000 mg via ORAL
  Filled 2016-12-27: qty 4

## 2016-12-27 MED ORDER — LIDOCAINE HCL (PF) 1 % IJ SOLN
INTRAMUSCULAR | Status: AC
  Filled 2016-12-27: qty 5

## 2016-12-27 NOTE — Discharge Instructions (Signed)
Please read attached information regarding your condition. Follow-up with results of remaining lab work when available. Return to ED for worsening discharge, severe abdominal pain, fevers, blood in urine, flank pain.

## 2016-12-27 NOTE — ED Provider Notes (Signed)
[facility]EMERGENCY DEPT Provider Note   CSN: [id number] Arrival date & time: 12/27/16  1505     History   Chief Complaint Chief Complaint  Patient presents with  . SEXUALLY TRANSMITTED DISEASE    HPI Alex Quinn is a 36 y.o. male.  HPI  Patient presents to ED for STD exposure. He reports penile discharge for the past week. He also reports hematuria which has resolved. He states that his sexual partner was treated for STD. He denies any abdominal pain, rashes, lesions, penile pain, scrotal pain, scrotal swelling, bowel changes, fever.  Past Medical History:  Diagnosis Date  . Dislocated shoulder     There are no active problems to display for this patient.   Past Surgical History:  Procedure Laterality Date  . MANDIBLE FRACTURE SURGERY    . ROTATOR CUFF REPAIR         Home Medications    Prior to Admission medications   Medication Sig Start Date End Date Taking? Authorizing Provider  cyclobenzaprine (FLEXERIL) 10 MG tablet Take 1 tablet (10 mg total) by mouth 2 (two) times daily as needed for muscle spasms. 11/21/16   Fayrene Helper, PA-C  HYDROcodone-acetaminophen (NORCO/VICODIN) 5-325 MG tablet Take 1-2 tablets by mouth every 4 (four) hours as needed. 06/07/16   Shaune Pollack, MD  ibuprofen (ADVIL,MOTRIN) 600 MG tablet Take 1 tablet (600 mg total) by mouth every 6 (six) hours as needed. 11/21/16   Fayrene Helper, PA-C  naproxen sodium (ANAPROX) 220 MG tablet Take 220 mg by mouth 2 (two) times daily as needed (headache).    [provider]  predniSONE (DELTASONE) 20 MG tablet Take 2 tablets (40 mg total) by mouth daily. 06/06/15   Pisciotta, Joni Reining, PA-C  Pseudoeph-Doxylamine-DM-APAP (NYQUIL PO) Take 30 mLs by mouth daily as needed (cold symptoms).    [provider]    Family History History reviewed. No pertinent family history.  Social History Social History  Substance Use Topics  . Smoking status: Former Smoker    Types: Cigarettes  . Smokeless  tobacco: Never Used  . Alcohol use No     Allergies   Shrimp [shellfish allergy]   Review of Systems Review of Systems  Constitutional: Negative for chills and fever.  Gastrointestinal: Negative for abdominal pain, nausea and vomiting.  Genitourinary: Positive for discharge and hematuria. Negative for dysuria, flank pain, penile pain, penile swelling, scrotal swelling, testicular pain and urgency.  Skin: Negative for color change and rash.     Physical Exam Updated Vital Signs BP (!) 134/97 (BP Location: Left Arm)   Pulse 65   Temp 97.6 F (36.4 C) (Oral)   Resp 18   Ht 5\' 5"  (1.651 m)   Wt 68.9 kg (152 lb)   SpO2 99%   BMI 25.29 kg/m   Physical Exam  Constitutional: He appears well-developed and well-nourished. No distress.  HENT:  Head: Normocephalic and atraumatic.  Eyes: Conjunctivae and EOM are normal. No scleral icterus.  Neck: Normal range of motion.  Pulmonary/Chest: Effort normal. No respiratory distress.  Genitourinary:  Genitourinary Comments: Patient deferred GU exam.  Neurological: He is alert.  Skin: No rash noted. He is not diaphoretic.  Psychiatric: He has a normal mood and affect.  Nursing note and vitals reviewed.    ED Treatments / Results  Labs (all labs ordered are listed, but only abnormal results are displayed) Labs Reviewed  URINALYSIS, ROUTINE W REFLEX MICROSCOPIC  HIV ANTIBODY (ROUTINE TESTING)  RPR  GC/CHLAMYDIA PROBE AMP ([place]) NOT  AT [facility]    EKG  EKG Interpretation None       Radiology No results found.  Procedures Procedures (including critical care time)  Medications Ordered in ED Medications  cefTRIAXone (ROCEPHIN) injection 250 mg (not administered)  azithromycin (ZITHROMAX) tablet 1,000 mg (not administered)     Initial Impression / Assessment and Plan / ED Course  I have reviewed the triage vital signs and the nursing notes.  Pertinent labs & imaging results that were available during my care of  the patient were reviewed by me and considered in my medical decision making (see chart for details).     Patient presents to ED for evaluation of penile discharge for the past week. Does have positive exposure to STDs by her recent partner after having sexual intercourse unprotected 1 week ago. Patient denies any rashes and deferred GU exam. Urinalysis collected and showed no evidence of UTI. GC chlamydia, HIV and RPR sent. Patient treated empirically for gonorrhea and chlamydia. Patient encouraged to follow up with the results of remaining lab work when available. Patient appears stable for discharge at this time. Strict return precautions given.  Final Clinical Impressions(s) / ED Diagnoses   Final diagnoses:  Possible exposure to STD    New Prescriptions New Prescriptions   No medications on file     Dietrich Pates, PA-C 12/27/16 1704    Arby Barrette, MD 12/28/16 [phone]

## 2016-12-27 NOTE — ED Triage Notes (Addendum)
Pt presents with c/o STD exposure. He reports penile discharge, hematuria that began yesterday and a recent sexual partner was just treated for STD

## 2016-12-28 LAB — GC/CHLAMYDIA PROBE AMP (~~LOC~~) NOT AT ARMC
Chlamydia: NEGATIVE
Neisseria Gonorrhea: POSITIVE — AB

## 2016-12-28 LAB — HIV ANTIBODY (ROUTINE TESTING W REFLEX): HIV SCREEN 4TH GENERATION: NONREACTIVE

## 2016-12-28 LAB — RPR: RPR: NONREACTIVE

## 2017-02-07 ENCOUNTER — Emergency Department (HOSPITAL_COMMUNITY)
Admission: EM | Admit: 2017-02-07 | Discharge: 2017-02-07 | Disposition: A | Discharge status: Home / Self Care | Payer: Self-pay | Attending: Emergency Medicine | Admitting: Emergency Medicine

## 2017-02-07 ENCOUNTER — Encounter (HOSPITAL_COMMUNITY): Payer: Self-pay | Admitting: Emergency Medicine

## 2017-02-07 DIAGNOSIS — Z711 Person with feared health complaint in whom no diagnosis is made: Secondary | ICD-10-CM | POA: Insufficient documentation

## 2017-02-07 DIAGNOSIS — Z79899 Other long term (current) drug therapy: Secondary | ICD-10-CM | POA: Insufficient documentation

## 2017-02-07 DIAGNOSIS — Z87891 Personal history of nicotine dependence: Secondary | ICD-10-CM | POA: Insufficient documentation

## 2017-02-07 MED ORDER — AZITHROMYCIN 250 MG PO TABS
1000.0000 mg | ORAL_TABLET | Freq: Once | ORAL | Status: AC
  Administered 2017-02-07: 1000 mg via ORAL
  Filled 2017-02-07: qty 4

## 2017-02-07 MED ORDER — LIDOCAINE HCL (PF) 1 % IJ SOLN
INTRAMUSCULAR | Status: AC
  Administered 2017-02-07: 5 mL
  Filled 2017-02-07: qty 5

## 2017-02-07 MED ORDER — CEFTRIAXONE SODIUM 250 MG IJ SOLR
250.0000 mg | Freq: Once | INTRAMUSCULAR | Status: AC
  Administered 2017-02-07: 250 mg via INTRAMUSCULAR
  Filled 2017-02-07: qty 250

## 2017-02-07 NOTE — ED Notes (Signed)
Declined W/C at D/C and was escorted to lobby by RN. 

## 2017-02-07 NOTE — ED Triage Notes (Signed)
Pt states he thinks his girlfriend has an STD. Pt just left jail for "messing up her house" bc he thinks she has an STD bc she took some pills and said "her vagina isnt working right.' denies drainage, does report urinary urgency and "penis is tingling."

## 2017-02-07 NOTE — ED Provider Notes (Signed)
[facility] Provider Note   CSN: [id number] Arrival date & time: 02/07/17  1405     History   Chief Complaint Chief Complaint  Patient presents with  . Exposure to STD    HPI Alex Quinn is a 36 y.o. male.  Alex Quinn is a 36 y.o. Male who presents to the ED concerned for STD and complaining of burning at the tip of his penis. Patient reports his girlfriend was diagnosed with a possible STD. He is unsure what this was. He reports he's been having some burning at the tip of his penis for about a week. He denies itching. He denies any discharge from his penis. He denies any pain to his penis or his testicles. No rashes noted. He denies history of STDs. He denies abdominal pain, penile discharge, testicular pain, rashes, nausea or vomiting.   The history is provided by the patient and medical records. No language interpreter was used.  Exposure to STD     Past Medical History:  Diagnosis Date  . Dislocated shoulder     There are no active problems to display for this patient.   Past Surgical History:  Procedure Laterality Date  . MANDIBLE FRACTURE SURGERY    . ROTATOR CUFF REPAIR         Home Medications    Prior to Admission medications   Medication Sig Start Date End Date Taking? Authorizing Provider  cyclobenzaprine (FLEXERIL) 10 MG tablet Take 1 tablet (10 mg total) by mouth 2 (two) times daily as needed for muscle spasms. 11/21/16   Fayrene Helper, PA-C  HYDROcodone-acetaminophen (NORCO/VICODIN) 5-325 MG tablet Take 1-2 tablets by mouth every 4 (four) hours as needed. 06/07/16   Shaune Pollack, MD  ibuprofen (ADVIL,MOTRIN) 600 MG tablet Take 1 tablet (600 mg total) by mouth every 6 (six) hours as needed. 11/21/16   Fayrene Helper, PA-C  naproxen sodium (ANAPROX) 220 MG tablet Take 220 mg by mouth 2 (two) times daily as needed (headache).    [provider]  predniSONE (DELTASONE) 20 MG tablet Take 2 tablets (40 mg total) by mouth daily. 06/06/15    Pisciotta, Joni Reining, PA-C  Pseudoeph-Doxylamine-DM-APAP (NYQUIL PO) Take 30 mLs by mouth daily as needed (cold symptoms).    [provider]    Family History No family history on file.  Social History Social History  Substance Use Topics  . Smoking status: Former Smoker    Types: Cigarettes  . Smokeless tobacco: Never Used  . Alcohol use No     Allergies   Shrimp [shellfish allergy]   Review of Systems Review of Systems  Constitutional: Negative for chills and fever.  HENT: Negative for mouth sores.   Genitourinary: Negative for difficulty urinating, discharge, dysuria, frequency, genital sores, penile pain, testicular pain and urgency.  Skin: Negative for rash and wound.     Physical Exam Updated Vital Signs BP (!) 136/98   Pulse 85   Temp 98.3 F (36.8 C) (Oral)   Resp 18   SpO2 100%   Physical Exam  Constitutional: He appears well-developed and well-nourished. No distress.  HENT:  Head: Normocephalic and atraumatic.  Eyes: Right eye exhibits no discharge. Left eye exhibits no discharge.  Pulmonary/Chest: Effort normal. No respiratory distress.  Abdominal: Soft. He exhibits no distension. There is no tenderness. There is no guarding.  Genitourinary: No penile tenderness.  Genitourinary Comments: GU exam with male NT as chaperone. No GU lesions or rashes. No penile discharge noted. No penile testicular tenderness to  palpation.  Neurological: He is alert. Coordination normal.  Skin: Skin is warm and dry. No rash noted. He is not diaphoretic. No erythema. No pallor.  Psychiatric: He has a normal mood and affect. His behavior is normal.  Nursing note and vitals reviewed.    ED Treatments / Results  Labs (all labs ordered are listed, but only abnormal results are displayed) Labs Reviewed  HIV ANTIBODY (ROUTINE TESTING)  RPR  GC/CHLAMYDIA PROBE AMP ([place]) NOT AT [facility]    EKG  EKG Interpretation None       Radiology No results  found.  Procedures Procedures (including critical care time)  Medications Ordered in ED Medications  cefTRIAXone (ROCEPHIN) injection 250 mg (not administered)  azithromycin (ZITHROMAX) tablet 1,000 mg (not administered)     Initial Impression / Assessment and Plan / ED Course  I have reviewed the triage vital signs and the nursing notes.  Pertinent labs & imaging results that were available during my care of the patient were reviewed by me and considered in my medical decision making (see chart for details).    This  is a 36 y.o. Male who presents to the ED concerned for STD and complaining of burning at the tip of his penis. Patient reports his girlfriend was diagnosed with a possible STD. He is unsure what this was. He reports he's been having some burning at the tip of his penis for about a week. He denies itching. He denies any discharge from his penis. He denies any pain to his penis or his testicles.  On exam patient is afebrile nontoxic appearing. His abdomen is soft and nontender to palpation. GU exam is unremarkable. No lesions or rashes. No penile or testicular tenderness to palpation. Will obtain gonorrhea, chlamydia, HIV and syphilis testing. I advised that he has pending testing and should follow-up on these test results in about 3 days. Will prophylactically treat him with Rocephin and azithromycin for gonorrhea and chlamydia. I encouraged him to follow-up with the health department. I discussed safe sex practices. I advised the patient to follow-up with their primary care provider this week. I advised the patient to return to the emergency department with new or worsening symptoms or new concerns. The patient verbalized understanding and agreement with plan.     Final Clinical Impressions(s) / ED Diagnoses   Final diagnoses:  Concern about STD in male without diagnosis    New Prescriptions New Prescriptions   No medications on file     Everlene Farrier, Cordelia Poche 02/07/17  1541    Nira Conn, MD 02/07/17 2051

## 2017-02-08 LAB — HIV ANTIBODY (ROUTINE TESTING W REFLEX): HIV SCREEN 4TH GENERATION: NONREACTIVE

## 2017-02-08 LAB — GC/CHLAMYDIA PROBE AMP (~~LOC~~) NOT AT ARMC
CHLAMYDIA, DNA PROBE: NEGATIVE
Neisseria Gonorrhea: NEGATIVE

## 2017-02-08 LAB — RPR: RPR: NONREACTIVE

## 2017-05-08 ENCOUNTER — Other Ambulatory Visit: Payer: Self-pay

## 2017-05-08 ENCOUNTER — Encounter (HOSPITAL_COMMUNITY): Payer: Self-pay | Admitting: Emergency Medicine

## 2017-05-08 DIAGNOSIS — Y9389 Activity, other specified: Secondary | ICD-10-CM | POA: Diagnosis not present

## 2017-05-08 DIAGNOSIS — S43004A Unspecified dislocation of right shoulder joint, initial encounter: Secondary | ICD-10-CM | POA: Insufficient documentation

## 2017-05-08 DIAGNOSIS — Y999 Unspecified external cause status: Secondary | ICD-10-CM | POA: Diagnosis not present

## 2017-05-08 DIAGNOSIS — Y929 Unspecified place or not applicable: Secondary | ICD-10-CM | POA: Diagnosis not present

## 2017-05-08 DIAGNOSIS — Z87891 Personal history of nicotine dependence: Secondary | ICD-10-CM | POA: Diagnosis not present

## 2017-05-08 DIAGNOSIS — S4991XA Unspecified injury of right shoulder and upper arm, initial encounter: Secondary | ICD-10-CM | POA: Diagnosis present

## 2017-05-08 DIAGNOSIS — X509XXA Other and unspecified overexertion or strenuous movements or postures, initial encounter: Secondary | ICD-10-CM | POA: Insufficient documentation

## 2017-05-08 MED ORDER — OXYCODONE-ACETAMINOPHEN 5-325 MG PO TABS
1.0000 | ORAL_TABLET | ORAL | Status: DC | PRN
  Administered 2017-05-08: 1 via ORAL
  Filled 2017-05-08: qty 1

## 2017-05-08 NOTE — ED Triage Notes (Signed)
Pt c/o right shoulder pain and possible dislocation. Hx of same. C/o right arm numbness.

## 2017-05-08 NOTE — ED Notes (Signed)
Pt refused to allow this tech to place a pulse ox on him

## 2017-05-09 ENCOUNTER — Emergency Department (HOSPITAL_COMMUNITY): Payer: Medicaid Other

## 2017-05-09 ENCOUNTER — Emergency Department (HOSPITAL_COMMUNITY)
Admission: EM | Admit: 2017-05-09 | Discharge: 2017-05-09 | Disposition: A | Discharge status: Home / Self Care | Payer: Medicaid Other | Attending: Emergency Medicine | Admitting: Emergency Medicine

## 2017-05-09 DIAGNOSIS — S43004A Unspecified dislocation of right shoulder joint, initial encounter: Secondary | ICD-10-CM

## 2017-05-09 MED ORDER — PROPOFOL 10 MG/ML IV BOLUS
0.5000 mg/kg | Freq: Once | INTRAVENOUS | Status: AC
  Administered 2017-05-09: 48.8 mg via INTRAVENOUS
  Filled 2017-05-09: qty 20

## 2017-05-09 MED ORDER — KETAMINE HCL-SODIUM CHLORIDE 100-0.9 MG/10ML-% IV SOSY
0.5000 mg/kg | PREFILLED_SYRINGE | Freq: Once | INTRAVENOUS | Status: AC
  Administered 2017-05-09: 49 mg via INTRAVENOUS
  Filled 2017-05-09: qty 10

## 2017-05-09 MED ORDER — HYDROCODONE-ACETAMINOPHEN 5-325 MG PO TABS: 1.0000 | ORAL_TABLET | ORAL | 0 refills | Status: DC | PRN

## 2017-05-09 NOTE — ED Provider Notes (Signed)
[facility]   CSN: [id number] Arrival date & time: 05/08/17  2325     History   Chief Complaint Chief Complaint  Patient presents with  . Shoulder Pain    HPI Alex Quinn is a 37 y.o. male.  Patient presents to the emergency department for evaluation of shoulder injury.  Patient reports previous history of dislocations, thinks it is dislocated again.  Patient reports that he injured his shoulder trying to move and lift a heavy object.  Patient complains of severe and constant pain, cannot move the arm at all.  The arm feels numb.      Past Medical History:  Diagnosis Date  . Dislocated shoulder     There are no active problems to display for this patient.   Past Surgical History:  Procedure Laterality Date  . MANDIBLE FRACTURE SURGERY    . ROTATOR CUFF REPAIR         Home Medications    Prior to Admission medications   Medication Sig Start Date End Date Taking? Authorizing Provider  cyclobenzaprine (FLEXERIL) 10 MG tablet Take 1 tablet (10 mg total) by mouth 2 (two) times daily as needed for muscle spasms. 11/21/16   Fayrene Helperran, [place], PA-C  HYDROcodone-acetaminophen (NORCO/VICODIN) 5-325 MG tablet Take 1-2 tablets by mouth every 4 (four) hours as needed. 06/07/16   Shaune PollackIsaacs, Cameron, MD  ibuprofen (ADVIL,MOTRIN) 600 MG tablet Take 1 tablet (600 mg total) by mouth every 6 (six) hours as needed. 11/21/16   Fayrene Helperran, [place], PA-C  naproxen sodium (ANAPROX) 220 MG tablet Take 220 mg by mouth 2 (two) times daily as needed (headache).    [provider]  predniSONE (DELTASONE) 20 MG tablet Take 2 tablets (40 mg total) by mouth daily. 06/06/15   Pisciotta, Joni ReiningNicole, PA-C  Pseudoeph-Doxylamine-DM-APAP (NYQUIL PO) Take 30 mLs by mouth daily as needed (cold symptoms).    [provider]    Family History No family history on file.  Social History Social History   Tobacco Use  . Smoking status: Former Smoker   Types: Cigarettes  . Smokeless tobacco: Never Used  Substance Use Topics  . Alcohol use: No  . Drug use: No     Allergies   Shrimp [shellfish allergy]   Review of Systems Review of Systems  Musculoskeletal: Positive for arthralgias.  All other systems reviewed and are negative.    Physical Exam Updated Vital Signs BP 136/90 (BP Location: Left Arm)   Pulse 84   Temp 98.7 F (37.1 C) (Oral)   Resp 17   Ht 6' (1.829 m)   Wt 97.5 kg (215 lb)   SpO2 96%   BMI 29.16 kg/m   Physical Exam  Constitutional: He is oriented to person, place, and time. He appears well-developed and well-nourished. No distress.  HENT:  Head: Normocephalic and atraumatic.  Right Ear: Hearing normal.  Left Ear: Hearing normal.  Nose: Nose normal.  Mouth/Throat: Oropharynx is clear and moist and mucous membranes are normal.  Eyes: Conjunctivae and EOM are normal. Pupils are equal, round, and reactive to light.  Neck: Normal range of motion. Neck supple.  Cardiovascular: Regular rhythm, S1 normal and S2 normal. Exam reveals no gallop and no friction rub.  No murmur heard. Pulmonary/Chest: Effort normal and breath sounds normal. No respiratory distress. He exhibits no tenderness.  Abdominal: Soft. Normal appearance and bowel sounds are normal. There is no hepatosplenomegaly. There is no tenderness. There is no rebound, no guarding, no tenderness  at McBurney's point and negative Murphy's sign. No hernia.  Musculoskeletal:       Right shoulder: He exhibits decreased range of motion and deformity.  Neurological: He is alert and oriented to person, place, and time. He has normal strength. No cranial nerve deficit or sensory deficit. Coordination normal. GCS eye subscore is 4. GCS verbal subscore is 5. GCS motor subscore is 6.  Skin: Skin is warm, dry and intact. No rash noted. No cyanosis.  Psychiatric: He has a normal mood and affect. His speech is normal and behavior is normal. Thought content normal.    Nursing note and vitals reviewed.    ED Treatments / Results  Labs (all labs ordered are listed, but only abnormal results are displayed) Labs Reviewed - No data to display  EKG  EKG Interpretation None       Radiology Dg Shoulder Right  Result Date: 05/09/2017 CLINICAL DATA:  Right shoulder pain after punching something. Right arm numbness. EXAM: RIGHT SHOULDER - 2+ VIEW COMPARISON:  06/07/2016 FINDINGS: Anterior dislocation of the right humeral head with respect to the glenoid. Medially displaced fracture fragment likely arising from the glenoid although the specific site is not appreciated. Coracoclavicular and acromioclavicular spaces are maintained. IMPRESSION: Anterior dislocation of the right shoulder with a displaced fracture fragment, likely arising from the glenoid. Electronically Signed   By: Burman Nieves M.D.   On: 05/09/2017 00:11    Procedures .Sedation Date/Time: 05/09/2017 1:08 AM Performed by: Gilda Crease, MD Authorized by: Gilda Crease, MD   Consent:    Consent obtained:  Written   Consent given by:  Patient   Risks discussed:  Inadequate sedation and respiratory compromise necessitating ventilatory assistance and intubation Universal protocol:    Procedure explained and questions answered to patient or proxy's satisfaction: yes     Relevant documents present and verified: yes     Test results available and properly labeled: yes     Imaging studies available: yes     Required blood products, implants, devices, and special equipment available: yes     Site/side marked: yes     Immediately prior to procedure a time out was called: yes     Patient identity confirmation method:  Verbally with patient Indications:    Procedure performed:  Dislocation reduction   Procedure necessitating sedation performed by:  Physician performing sedation   Intended level of sedation:  Moderate (conscious sedation) Pre-sedation assessment:    Time  since last food or drink:  >4hours   ASA classification: class 1 - normal, healthy patient     Neck mobility: normal     Mouth opening:  3 or more finger widths   Thyromental distance:  4 finger widths   Mallampati score:  I - soft palate, uvula, fauces, pillars visible   Pre-sedation assessments completed and reviewed: airway patency, cardiovascular function, hydration status, mental status, nausea/vomiting and respiratory function     Pre-sedation assessments completed and reviewed comment:  Normal cardiovascular function, no nausea, vomiting, normal mental status, normal airway patency   Pre-sedation assessment completed:  05/09/2017 12:50 AM Immediate pre-procedure details:    Reassessment: Patient reassessed immediately prior to procedure     Reviewed: vital signs     Verified: bag valve mask available, emergency equipment available, intubation equipment available, IV patency confirmed, oxygen available and suction available   Procedure details (see MAR for exact dosages):    Preoxygenation:  Nasal cannula   Sedation:  Etomidate and propofol  Intra-procedure monitoring:  Blood pressure monitoring, cardiac monitor, continuous pulse oximetry, frequent LOC assessments and frequent vital sign checks   Intra-procedure events: none     Total Provider sedation time (minutes):  15 Post-procedure details:    Post-sedation assessment completed:  05/09/2017 1:25 AM   Post-sedation assessments completed and reviewed: airway patency, cardiovascular function, mental status, nausea/vomiting, pain level and respiratory function     Patient is stable for discharge or admission: yes     Patient tolerance:  Tolerated well, no immediate complications  ORTHOPEDIC INJURY TREATMENT Date/Time: 05/09/2017 1:14 AM Performed by: Gilda Crease, MD Authorized by: Gilda Crease, MD   Consent:    Consent obtained:  Written   Consent given by:  Patient   Risks discussed:  Recurrent  dislocation Universal protocol:    Procedure explained and questions answered to patient or proxy's satisfaction: yes     Relevant documents present and verified: yes     Test results available and properly labeled: yes     Imaging studies available: yes     Required blood products, implants, devices, and special equipment available: yes     Site/side marked: yes     Immediately prior to procedure a time out was called: yes     Patient identity confirmed:  Verbally with patientInjury location: shoulder Location details: right shoulder Injury type: dislocation Pre-procedure neurovascular assessment: neurovascularly intact Pre-procedure distal perfusion: normal Pre-procedure neurological function: normal Pre-procedure range of motion: reduced  Patient sedated: Yes. Refer to sedation procedure documentation for details of sedation. Manipulation performed: yes Reduction method: external rotation and Milch technique Reduction successful: yes X-ray confirmed reduction: yes Immobilization: sling Post-procedure neurovascular assessment: post-procedure neurovascularly intact Post-procedure distal perfusion: normal Post-procedure neurological function: normal Post-procedure range of motion: normal Patient tolerance: Patient tolerated the procedure well with no immediate complications    (including critical care time)  Medications Ordered in ED Medications  oxyCODONE-acetaminophen (PERCOCET/ROXICET) 5-325 MG per tablet 1 tablet (1 tablet Oral Given 05/08/17 2352)  ketamine 100 mg in normal saline 10 mL (10mg /mL) syringe (49 mg Intravenous Given 05/09/17 0100)  propofol (DIPRIVAN) 10 mg/mL bolus/IV push 48.8 mg (48.8 mg Intravenous Given 05/09/17 0100)     Initial Impression / Assessment and Plan / ED Course  I have reviewed the triage vital signs and the nursing notes.  Pertinent labs & imaging results that were available during my care of the patient were reviewed by me and considered in  my medical decision making (see chart for details).     Patient presents with recurrent right shoulder dislocation.  Closed reduction was successfully performed.  X-ray does show a fracture fragment, possibly of the glenoid.  Patient placed in immobilizer, will follow up with orthopedics.  Final Clinical Impressions(s) / ED Diagnoses   Final diagnoses:  Shoulder dislocation, right, initial encounter    ED Discharge Orders    None       Pollina, Canary Brim, MD 05/09/17 [phone]

## 2017-05-30 ENCOUNTER — Encounter (HOSPITAL_COMMUNITY): Payer: Self-pay

## 2017-05-30 ENCOUNTER — Emergency Department (HOSPITAL_COMMUNITY): Payer: Medicaid Other

## 2017-05-30 ENCOUNTER — Emergency Department (HOSPITAL_COMMUNITY)
Admission: EM | Admit: 2017-05-30 | Discharge: 2017-05-31 | Disposition: A | Discharge status: Home / Self Care | Payer: Medicaid Other | Attending: Emergency Medicine | Admitting: Emergency Medicine

## 2017-05-30 DIAGNOSIS — X58XXXA Exposure to other specified factors, initial encounter: Secondary | ICD-10-CM | POA: Diagnosis not present

## 2017-05-30 DIAGNOSIS — S43004A Unspecified dislocation of right shoulder joint, initial encounter: Secondary | ICD-10-CM | POA: Insufficient documentation

## 2017-05-30 DIAGNOSIS — Y939 Activity, unspecified: Secondary | ICD-10-CM | POA: Insufficient documentation

## 2017-05-30 DIAGNOSIS — Y92003 Bedroom of unspecified non-institutional (private) residence as the place of occurrence of the external cause: Secondary | ICD-10-CM | POA: Insufficient documentation

## 2017-05-30 DIAGNOSIS — S4991XA Unspecified injury of right shoulder and upper arm, initial encounter: Secondary | ICD-10-CM | POA: Diagnosis present

## 2017-05-30 DIAGNOSIS — Z87891 Personal history of nicotine dependence: Secondary | ICD-10-CM | POA: Insufficient documentation

## 2017-05-30 DIAGNOSIS — Y999 Unspecified external cause status: Secondary | ICD-10-CM | POA: Diagnosis not present

## 2017-05-30 MED ORDER — PROPOFOL 10 MG/ML IV BOLUS
INTRAVENOUS | Status: AC
  Filled 2017-05-30: qty 20

## 2017-05-30 MED ORDER — KETOROLAC TROMETHAMINE 30 MG/ML IJ SOLN
30.0000 mg | Freq: Once | INTRAMUSCULAR | Status: AC
Start: 2017-05-30 — End: 2017-05-30
  Administered 2017-05-30: 30 mg via INTRAVENOUS

## 2017-05-30 MED ORDER — PROPOFOL 10 MG/ML IV BOLUS
2.0000 mg/kg | Freq: Once | INTRAVENOUS | Status: AC
  Administered 2017-05-30: via INTRAVENOUS

## 2017-05-30 MED ORDER — SODIUM CHLORIDE 0.9 % IV BOLUS (SEPSIS)
1000.0000 mL | Freq: Once | INTRAVENOUS | Status: AC
  Administered 2017-05-30: 1000 mL via INTRAVENOUS

## 2017-05-30 MED ORDER — KETOROLAC TROMETHAMINE 30 MG/ML IJ SOLN
INTRAMUSCULAR | Status: AC
  Filled 2017-05-30: qty 1

## 2017-05-30 MED ORDER — FENTANYL CITRATE (PF) 100 MCG/2ML IJ SOLN
50.0000 ug | INTRAMUSCULAR | Status: DC | PRN
  Administered 2017-05-30: 50 ug via INTRAVENOUS
  Filled 2017-05-30: qty 2

## 2017-05-30 NOTE — ED Notes (Signed)
Pt called staff to room again, pt stating " we aint about to go through this patient doctor bullshit"  Attempts made to explain to patient need for cardiac monitor for sedation, will attempt again to place pt on CCM and prepare of sedation

## 2017-05-30 NOTE — ED Triage Notes (Signed)
Pt c/o right shoulder pain states he feels like it is dislocated happened while asleep.  Here recently for same.

## 2017-05-30 NOTE — ED Notes (Signed)
Patient transported to X-ray 

## 2017-05-30 NOTE — ED Notes (Signed)
Pt to the room from xray but wants to stay in the wheel chair at this moment

## 2017-05-30 NOTE — ED Notes (Signed)
Pt refused to get in the bed from the wheel chair. I could not hook Pt to the monitor. Pt wanted to push the call bell I asked him what he needed Pt refused to answer

## 2017-05-30 NOTE — ED Notes (Signed)
Attempts made to explain to patient why we are unable to give him water at this time, pt very upset, pt did confirm he received Fentanyl but states "I dont know why yall give me that dumb ass shit" Again pt explained due to pending sedation pt needs to be NPO at this time.

## 2017-05-30 NOTE — ED Notes (Signed)
Pt in xray. Pt to be brought to room after

## 2017-05-31 ENCOUNTER — Emergency Department (HOSPITAL_COMMUNITY): Payer: Medicaid Other

## 2017-05-31 MED ORDER — METHOCARBAMOL 500 MG PO TABS: 500.0000 mg | ORAL_TABLET | Freq: Two times a day (BID) | ORAL | 0 refills | Status: DC

## 2017-05-31 MED ORDER — IBUPROFEN 800 MG PO TABS: 800.0000 mg | ORAL_TABLET | Freq: Three times a day (TID) | ORAL | 0 refills | Status: DC

## 2017-05-31 NOTE — Discharge Instructions (Signed)
1. Medications: alternate ibuprofen and tylenol for pain control, Robaxin, usual home medications 2. Treatment: rest, ice, and use brace, drink plenty of fluids, gentle stretching 3. Follow Up: Please followup with orthopedics as directed or your PCP in 1 week if no improvement for discussion of your diagnoses and further evaluation after today's visit; if you do not have a primary care doctor use the resource guide provided to find one; Please return to the ER for worsening symptoms or other concerns

## 2017-05-31 NOTE — ED Provider Notes (Signed)
[facility] EMERGENCY DEPARTMENT Provider Note   CSN: [id number] Arrival date & time: 05/30/17  2205   Medical screening examination/treatment/procedure(s) were conducted as a shared visit with non-physician practitioner(s) and myself.  I personally evaluated the patient during the encounter.   EKG Interpretation None        History   Chief Complaint Chief Complaint  Patient presents with  . Shoulder Pain    HPI Alex Quinn is a 37 y.o. male.  The history is provided by the patient.  Shoulder Pain   This is a recurrent problem. The current episode started 1 to 2 hours ago. The problem occurs constantly. The problem has not changed since onset.The pain is present in the right shoulder. The quality of the pain is described as aching. The pain is at a severity of 10/10. The pain is severe. Pertinent negatives include no numbness. He has tried nothing for the symptoms. The treatment provided no relief. There has been no history of extremity trauma. Family history is significant for no rheumatoid arthritis.    Past Medical History:  Diagnosis Date  . Dislocated shoulder     There are no active problems to display for this patient.   Past Surgical History:  Procedure Laterality Date  . MANDIBLE FRACTURE SURGERY    . ROTATOR CUFF REPAIR         Home Medications    Prior to Admission medications   Medication Sig Start Date End Date Taking? Authorizing Provider  ibuprofen (ADVIL,MOTRIN) 800 MG tablet Take 1 tablet (800 mg total) by mouth 3 (three) times daily. 05/31/17   Muthersbaugh, Dahlia Client, PA-C  methocarbamol (ROBAXIN) 500 MG tablet Take 1 tablet (500 mg total) by mouth 2 (two) times daily. 05/31/17   Muthersbaugh, Boyd Kerbs    Family History History reviewed. No pertinent family history.  Social History Social History   Tobacco Use  . Smoking status: Former Smoker    Types: Cigarettes  . Smokeless tobacco: Never Used  Substance Use  Topics  . Alcohol use: No  . Drug use: No     Allergies   Patient has no known allergies.   Review of Systems Review of Systems  Musculoskeletal: Positive for arthralgias.  Neurological: Negative for numbness.  All other systems reviewed and are negative.    Physical Exam Updated Vital Signs BP 125/80   Pulse 75   Temp 97.9 F (36.6 C) (Oral)   Resp (!) 34   Ht 6' (1.829 m)   Wt 99.8 kg (220 lb)   SpO2 98%   BMI 29.84 kg/m   Physical Exam  Constitutional: He is oriented to person, place, and time. He appears well-developed and well-nourished.  HENT:  Head: Normocephalic and atraumatic.  Eyes: EOM are normal.  Neck: Normal range of motion. Neck supple.  Cardiovascular: Normal rate, regular rhythm, normal heart sounds and intact distal pulses.  Pulmonary/Chest: Effort normal and breath sounds normal. No respiratory distress.  Abdominal: Soft. Bowel sounds are normal. There is no tenderness.  Musculoskeletal:       Right shoulder: He exhibits decreased range of motion and deformity. He exhibits no effusion and normal pulse.  Neurological: He is alert and oriented to person, place, and time.  Skin: Skin is warm and dry. Capillary refill takes less than 2 seconds.     ED Treatments / Results  Labs (all labs ordered are listed, but only abnormal results are displayed) Labs Reviewed - No data to display  EKG  EKG Interpretation None       Radiology Dg Shoulder Right  Result Date: 05/30/2017 CLINICAL DATA:  Right shoulder pain and dislocation. Initial encounter. EXAM: RIGHT SHOULDER - 2+ VIEW COMPARISON:  05/09/2017 FINDINGS: Anterior dislocation of the right humeral head is seen. 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 No evidence of acute fracture. IMPRESSION: Anterior dislocation of right shoulder. No evidence of acute fracture. Electronically Signed   By: Myles Rosenthal M.D.   On: 05/30/2017 23:08   Dg Shoulder Right Portable  Result Date:  05/31/2017 CLINICAL DATA:  Reduction of shoulder dislocation EXAM: PORTABLE RIGHT SHOULDER COMPARISON:  Shoulder radiograph 05/30/2017 FINDINGS: There is improved alignment at the glenohumeral joint following shoulder dislocation reduction. There is no visible fracture. IMPRESSION: Improved alignment of the glenohumeral joint. Electronically Signed   By: Deatra Robinson M.D.   On: 05/31/2017 00:24    Procedures .Sedation Date/Time: 05/30/2017 11:45 PM Performed by: Cy Blamer, MD Authorized by: Cy Blamer, MD   Consent:    Consent obtained:  Verbal   Consent given by:  Patient   Risks discussed:  Allergic reaction, dysrhythmia, inadequate sedation, nausea, prolonged hypoxia resulting in organ damage, prolonged sedation necessitating reversal, respiratory compromise necessitating ventilatory assistance and intubation and vomiting   Alternatives discussed:  Analgesia without sedation, anxiolysis and regional anesthesia Universal protocol:    Procedure explained and questions answered to patient or proxy's satisfaction: yes     Relevant documents present and verified: yes     Test results available and properly labeled: yes     Imaging studies available: yes     Required blood products, implants, devices, and special equipment available: yes     Site/side marked: yes     Immediately prior to procedure a time out was called: yes     Patient identity confirmation method:  Verbally with patient and arm band Indications:    Procedure necessitating sedation performed by:  Physician performing sedation   Intended level of sedation:  Deep Pre-sedation assessment:    Time since last food or drink:  6   ASA classification: class 1 - normal, healthy patient     Neck mobility: normal     Mouth opening:  3 or more finger widths   Thyromental distance:  4 finger widths   Mallampati score:  I - soft palate, uvula, fauces, pillars visible   Pre-sedation assessments completed and reviewed: airway  patency, cardiovascular function, hydration status, mental status, nausea/vomiting, pain level, respiratory function and temperature   Immediate pre-procedure details:    Reassessment: Patient reassessed immediately prior to procedure     Reviewed: vital signs, relevant labs/tests and NPO status     Verified: bag valve mask available, emergency equipment available, intubation equipment available, IV patency confirmed, oxygen available and suction available   Procedure details (see MAR for exact dosages):    Preoxygenation:  Nasal cannula   Sedation:  Propofol   Intra-procedure monitoring:  Blood pressure monitoring, cardiac monitor, continuous pulse oximetry, frequent LOC assessments, frequent vital sign checks and continuous capnometry   Intra-procedure events: none     Intra-procedure management:  Fluid bolus (none needed)   Reversal agents: none.   Total Provider sedation time (minutes):  20 Post-procedure details:    Attendance: Constant attendance by certified staff until patient recovered     Recovery: Patient returned to pre-procedure baseline     Post-sedation assessments completed and reviewed: airway patency, cardiovascular function, hydration status, mental status, nausea/vomiting, pain level, respiratory function  and temperature     Patient is stable for discharge or admission: yes     Patient tolerance:  Tolerated well, no immediate complications    (including critical care time)  Medications Ordered in ED Medications  fentaNYL (SUBLIMAZE) injection 50 mcg (50 mcg Intravenous Given 05/30/17 2223)  propofol (DIPRIVAN) 10 mg/mL bolus/IV push (not administered)  ketorolac (TORADOL) 30 MG/ML injection (not administered)  ketorolac (TORADOL) 30 MG/ML injection 30 mg (30 mg Intravenous Given 05/30/17 2357)  propofol (DIPRIVAN) 10 mg/mL bolus/IV push 199.6 mg ( Intravenous Given 05/30/17 2350)  sodium chloride 0.9 % bolus 1,000 mL (0 mLs Intravenous Stopped 05/31/17 0117)        Final Clinical Impressions(s) / ED Diagnoses   Final diagnoses:  Dislocation of right shoulder joint, initial encounter  Follow up with orthopedics for ongoing care   ED Discharge Orders        Ordered    methocarbamol (ROBAXIN) 500 MG tablet  2 times daily     05/31/17 0047    ibuprofen (ADVIL,MOTRIN) 800 MG tablet  3 times daily     05/31/17 0047       Judi Jaffe, MD 05/31/17 [id number]

## 2017-05-31 NOTE — ED Notes (Signed)
Xray complete

## 2017-05-31 NOTE — ED Provider Notes (Signed)
[facility] EMERGENCY DEPARTMENT Provider Note   CSN: [id number] Arrival date & time: 05/30/17  2205     History   Chief Complaint Chief Complaint  Patient presents with  . Shoulder Pain    HPI Alex Quinn is a 37 y.o. male with a hx of recurrently dislocated shoulder presents to the Emergency Department complaining of acute, persistent, dislocation of the right shoulder onset approximately 30 minutes prior to arrival.  Patient's fianc reports that he was asleep and dreaming.  When he rolled over, his shoulder dislocated.  Patient reports some numbness to his fingers and inability to move the shoulder.  No treatments prior to arrival.  Nothing makes his symptoms better or worse. Pt denies known trauma.  Patient and fianc report he has never seen an orthopedist about this.  Patient is agitated and belligerent.  He offers no additional details.   The history is provided by the patient, medical records and a significant other. No language interpreter was used.    Past Medical History:  Diagnosis Date  . Dislocated shoulder     There are no active problems to display for this patient.   Past Surgical History:  Procedure Laterality Date  . MANDIBLE FRACTURE SURGERY    . ROTATOR CUFF REPAIR         Home Medications    Prior to Admission medications   Medication Sig Start Date End Date Taking? Authorizing Provider  ibuprofen (ADVIL,MOTRIN) 800 MG tablet Take 1 tablet (800 mg total) by mouth 3 (three) times daily. 05/31/17   Shyla Gayheart, Dahlia Client, PA-C  methocarbamol (ROBAXIN) 500 MG tablet Take 1 tablet (500 mg total) by mouth 2 (two) times daily. 05/31/17   Jerrie Gullo, Quinn Kerbs    Family History History reviewed. No pertinent family history.  Social History Social History   Tobacco Use  . Smoking status: Former Smoker    Types: Cigarettes  . Smokeless tobacco: Never Used  Substance Use Topics  . Alcohol use: No  . Drug use: No      Allergies   Patient has no known allergies.   Review of Systems Review of Systems  Constitutional: Negative for appetite change, diaphoresis, fatigue, fever and unexpected weight change.  HENT: Negative for mouth sores.   Eyes: Negative for visual disturbance.  Respiratory: Negative for cough, chest tightness, shortness of breath and wheezing.   Cardiovascular: Negative for chest pain.  Gastrointestinal: Negative for abdominal pain, constipation, diarrhea, nausea and vomiting.  Endocrine: Negative for polydipsia, polyphagia and polyuria.  Genitourinary: Negative for dysuria, frequency, hematuria and urgency.  Musculoskeletal: Positive for arthralgias. Negative for back pain and neck stiffness.  Skin: Negative for rash.  Allergic/Immunologic: Negative for immunocompromised state.  Neurological: Negative for syncope, light-headedness and headaches.  Hematological: Does not bruise/bleed easily.  Psychiatric/Behavioral: Negative for sleep disturbance. The patient is not nervous/anxious.      Physical Exam Updated Vital Signs BP 121/73   Pulse 80   Temp 97.9 F (36.6 C) (Oral)   Resp (!) 34   Ht 6' (1.829 m)   Wt 99.8 kg (220 lb)   SpO2 100%   BMI 29.84 kg/m   Physical Exam  Constitutional: He appears well-developed and well-nourished. No distress.  HENT:  Head: Normocephalic and atraumatic.  Eyes: Conjunctivae are normal.  Neck: Normal range of motion.  Cardiovascular: Normal rate, regular rhythm and intact distal pulses.  Pulses:      Radial pulses are 2+ on the right side, and 2+ on the  left side.  Capillary refill < 3 sec  Pulmonary/Chest: Effort normal and breath sounds normal.  Abdominal: Soft. He exhibits no distension. There is no tenderness.  Musculoskeletal: He exhibits tenderness. He exhibits no edema.       Right shoulder: He exhibits decreased range of motion, tenderness and deformity.  Right shoulder with palpable deformity.  Significant tenderness  to palpation and decreased range of motion.  Full range of motion of the right elbow wrist and fingers.  Neurological: He is alert. Coordination normal.  Sensation decreased in the hand to normal touch Strength 5/5 in the right hand and wrist.  Not tested in the shoulder.  Skin: Skin is warm and dry. He is not diaphoretic.  No tenting of the skin  Psychiatric: He has a normal mood and affect.  Nursing note and vitals reviewed.    ED Treatments / Results   Radiology Dg Shoulder Right  Result Date: 05/30/2017 CLINICAL DATA:  Right shoulder pain and dislocation. Initial encounter. EXAM: RIGHT SHOULDER - 2+ VIEW COMPARISON:  05/09/2017 FINDINGS: Anterior dislocation of the right humeral head is seen. 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 No evidence of acute fracture. IMPRESSION: Anterior dislocation of right shoulder. No evidence of acute fracture. Electronically Signed   By: Myles RosenthalJohn  Stahl M.D.   On: 05/30/2017 23:08   Dg Shoulder Right Portable  Result Date: 05/31/2017 CLINICAL DATA:  Reduction of shoulder dislocation EXAM: PORTABLE RIGHT SHOULDER COMPARISON:  Shoulder radiograph 05/30/2017 FINDINGS: There is improved alignment at the glenohumeral joint following shoulder dislocation reduction. There is no visible fracture. IMPRESSION: Improved alignment of the glenohumeral joint. Electronically Signed   By: Deatra RobinsonKevin  Herman M.D.   On: 05/31/2017 00:24    Procedures Reduction of dislocation Date/Time: 05/30/2017 11:47 PM Performed by: Dierdre ForthMuthersbaugh, Ravinder Lukehart, PA-C Authorized by: Dierdre ForthMuthersbaugh, Viyaan Champine, PA-C  Consent: Verbal consent obtained. Written consent obtained. Risks and benefits: risks, benefits and alternatives were discussed Consent given by: patient (and Fiance) Patient understanding: patient states understanding of the procedure being performed Patient consent: the patient's understanding of the procedure matches consent given Procedure consent: procedure consent  matches procedure scheduled Relevant documents: relevant documents present and verified Test results: test results available and properly labeled Site marked: the operative site was marked Imaging studies: imaging studies available Required items: required blood products, implants, devices, and special equipment available Patient identity confirmed: verbally with patient and arm band Time out: Immediately prior to procedure a "time out" was called to verify the correct patient, procedure, equipment, support staff and site/side marked as required. Preparation: Patient was prepped and draped in the usual sterile fashion.  Sedation: Patient sedated: yes Sedation type: moderate (conscious) sedation Sedatives: propofol Analgesia: toradol. Sedation start date/time: 05/30/2017 11:47 PM Sedation end date/time: 05/31/2017 12:40 AM Vitals: Vital signs were monitored during sedation.  Patient tolerance: Patient tolerated the procedure well with no immediate complications Comments: Complete reduction of the right shoulder dislocation without complication    (including critical care time)  Medications Ordered in ED Medications  fentaNYL (SUBLIMAZE) injection 50 mcg (50 mcg Intravenous Given 05/30/17 2223)  propofol (DIPRIVAN) 10 mg/mL bolus/IV push (not administered)  ketorolac (TORADOL) 30 MG/ML injection (not administered)  ketorolac (TORADOL) 30 MG/ML injection 30 mg (30 mg Intravenous Given 05/30/17 2357)  propofol (DIPRIVAN) 10 mg/mL bolus/IV push 199.6 mg ( Intravenous Given 05/30/17 2350)  sodium chloride 0.9 % bolus 1,000 mL (1,000 mLs Intravenous New Bag/Given 05/30/17 2346)     Initial Impression / Assessment and Plan /  ED Course  I have reviewed the triage vital signs and the nursing notes.  Pertinent labs & imaging results that were available during my care of the patient were reviewed by me and considered in my medical decision making (see chart for details).     Patient presents  with right anterior shoulder dislocation.  Patient has a long history of recurrent dislocations.  Capillary refill is normal.  He reports numbness in the fingers, patient reports this is normal when he dislocates his shoulder.  Plain film confirms anterior shoulder dislocation.  I personally reviewed the films.    Shoulder reduction without complication.  Conscious sedation was attended by Dr Nicanor Alcon.    Repeat films confirm reduction of the dislocation.  On repeat exam patient with improved range of motion of the right shoulder, full range of motion of the elbow wrist and fingers.  Capillary refill less than 3 seconds.  Sensation intact.  Patient reports numbness has resolved.  He is well-appearing.  No complications of sedation.  He is tolerated p.o. and ambulates without difficulty here in the emergency department.     Final Clinical Impressions(s) / ED Diagnoses   Final diagnoses:  Dislocation of right shoulder joint, initial encounter    ED Discharge Orders        Ordered    methocarbamol (ROBAXIN) 500 MG tablet  2 times daily     05/31/17 0047    ibuprofen (ADVIL,MOTRIN) 800 MG tablet  3 times daily     05/31/17 0047       Luman Holway, Quinn Kerbs 05/31/17 0122    Palumbo, April, MD 05/31/17 1610

## 2017-06-04 ENCOUNTER — Emergency Department (HOSPITAL_COMMUNITY)
Admission: EM | Admit: 2017-06-04 | Discharge: 2017-06-04 | Disposition: A | Discharge status: Home / Self Care | Payer: Medicaid Other | Attending: Emergency Medicine | Admitting: Emergency Medicine

## 2017-06-04 ENCOUNTER — Other Ambulatory Visit: Payer: Self-pay

## 2017-06-04 ENCOUNTER — Encounter (HOSPITAL_COMMUNITY): Payer: Self-pay | Admitting: *Deleted

## 2017-06-04 DIAGNOSIS — Z202 Contact with and (suspected) exposure to infections with a predominantly sexual mode of transmission: Secondary | ICD-10-CM | POA: Insufficient documentation

## 2017-06-04 DIAGNOSIS — R360 Urethral discharge without blood: Secondary | ICD-10-CM | POA: Diagnosis present

## 2017-06-04 DIAGNOSIS — Z87891 Personal history of nicotine dependence: Secondary | ICD-10-CM | POA: Diagnosis not present

## 2017-06-04 DIAGNOSIS — R369 Urethral discharge, unspecified: Secondary | ICD-10-CM

## 2017-06-04 LAB — URINALYSIS, ROUTINE W REFLEX MICROSCOPIC
BACTERIA UA: NONE SEEN
BILIRUBIN URINE: NEGATIVE
GLUCOSE, UA: NEGATIVE mg/dL
HGB URINE DIPSTICK: NEGATIVE
KETONES UR: NEGATIVE mg/dL
NITRITE: NEGATIVE
PROTEIN: NEGATIVE mg/dL
Specific Gravity, Urine: 1.017 (ref 1.005–1.030)
Squamous Epithelial / LPF: NONE SEEN
pH: 6 (ref 5.0–8.0)

## 2017-06-04 LAB — RAPID HIV SCREEN (HIV 1/2 AB+AG)
HIV 1/2 Antibodies: NONREACTIVE
HIV-1 P24 ANTIGEN - HIV24: NONREACTIVE

## 2017-06-04 MED ORDER — CEFTRIAXONE SODIUM 250 MG IJ SOLR
250.0000 mg | Freq: Once | INTRAMUSCULAR | Status: AC
Start: 2017-06-04 — End: 2017-06-04
  Administered 2017-06-04: 250 mg via INTRAMUSCULAR
  Filled 2017-06-04: qty 250

## 2017-06-04 MED ORDER — AZITHROMYCIN 250 MG PO TABS
1000.0000 mg | ORAL_TABLET | Freq: Once | ORAL | Status: AC
  Administered 2017-06-04: 1000 mg via ORAL
  Filled 2017-06-04: qty 4

## 2017-06-04 MED ORDER — LIDOCAINE HCL (PF) 1 % IJ SOLN
INTRAMUSCULAR | Status: AC
  Administered 2017-06-04: 0.9 mL
  Filled 2017-06-04: qty 5

## 2017-06-04 NOTE — Discharge Instructions (Signed)
You were seen in the emergency department due to concern for STD exposure. We have tested you for gonorrhea, chlamydia, syphilis, and HIV. You were treated empirically for gonorrhea and chlamydia with antibiotic in the emergency department. We will.  If any of your test results come back positive you will need to inform all sexual partners so that they are able to be evaluated and potentially treated if necessary.  Do not have intercourse for the next 1 week to avoid potential transmission of infection.  Follow up with your primary care provider, the health department, [facility] in 1 week for re-evaluation. Return to the emergency but not limited to fever, chills, nausea, vomiting, abdominal pain, pain with bowel movements, or any other concerns you may have.

## 2017-06-04 NOTE — ED Triage Notes (Signed)
Pt reports needing treatment for std, started having symptoms today.

## 2017-06-04 NOTE — ED Provider Notes (Signed)
[facility] Provider Note   CSN: [id number] Arrival date & time: 06/04/17  1403     History   Chief Complaint Chief Complaint  Patient presents with  . Exposure to STD    HPI Alex Quinn is a 37 y.o. male who presents to the ED with concern for STD exposure with intermittent penile discharge that started today. Patient states that sxs onset this morning. States discharge is milky and somewhat grey/green. No alleviating/aggravating factors. States he suspect previous partner had gonorrhea. Denies dysuria, fever, chills, abdominal pain, testicular pain/swelling, or pain with bowel movement.   HPI  Past Medical History:  Diagnosis Date  . Dislocated shoulder     There are no active problems to display for this patient.   Past Surgical History:  Procedure Laterality Date  . MANDIBLE FRACTURE SURGERY    . ROTATOR CUFF REPAIR         Home Medications    Prior to Admission medications   Medication Sig Start Date End Date Taking? Authorizing Provider  ibuprofen (ADVIL,MOTRIN) 800 MG tablet Take 1 tablet (800 mg total) by mouth 3 (three) times daily. 05/31/17   Muthersbaugh, Dahlia ClientHannah, PA-C  methocarbamol (ROBAXIN) 500 MG tablet Take 1 tablet (500 mg total) by mouth 2 (two) times daily. 05/31/17   Muthersbaugh, Boyd KerbsHannah, PA-C    Family History History reviewed. No pertinent family history.  Social History Social History   Tobacco Use  . Smoking status: Former Smoker    Types: Cigarettes  . Smokeless tobacco: Never Used  Substance Use Topics  . Alcohol use: No  . Drug use: No     Allergies   Patient has no known allergies.   Review of Systems Review of Systems  Constitutional: Negative for chills and fever.  Gastrointestinal: Negative for abdominal pain, blood in stool, constipation, diarrhea, nausea, rectal pain and vomiting.  Genitourinary: Positive for discharge. Negative for dysuria, penile swelling, scrotal swelling  and testicular pain.    Physical Exam Updated Vital Signs BP (!) 133/97 (BP Location: Left Arm)   Pulse 74   Temp 98.8 F (37.1 C) (Oral)   Resp 18   SpO2 99%   Physical Exam  Constitutional: He appears well-developed and well-nourished. No distress.  HENT:  Head: Normocephalic and atraumatic.  Eyes: Conjunctivae are normal. Right eye exhibits no discharge. Left eye exhibits no discharge.  Abdominal: Soft. He exhibits no distension. There is no tenderness. There is no rebound and no guarding.  Genitourinary: Testes normal. Cremasteric reflex is present. Right testis shows no mass, no swelling and no tenderness. Left testis shows no mass, no swelling and no tenderness. Circumcised. No penile tenderness. No discharge found.  Genitourinary Comments: Alex Ridgelaylor RN present as chaperone.   Neurological: He is alert.  Clear speech.   Psychiatric: He has a normal mood and affect. His behavior is normal. Thought content normal.  Nursing note and vitals reviewed.  ED Treatments / Results  Labs (all labs ordered are listed, but only abnormal results are displayed) Labs Reviewed  URINALYSIS, ROUTINE W REFLEX MICROSCOPIC - Abnormal; Notable for the following components:      Result Value   Leukocytes, UA TRACE (*)    All other components within normal limits  RAPID HIV SCREEN (HIV 1/2 AB+AG)  HIV ANTIBODY (ROUTINE TESTING)  GC/CHLAMYDIA PROBE AMP ([place]   EKG  EKG Interpretation None      Radiology No results found.  Procedures Procedures (including critical care  time)  Medications Ordered in ED Medications  azithromycin (ZITHROMAX) tablet 1,000 mg (1,000 mg Oral Given 06/04/17 1555)  cefTRIAXone (ROCEPHIN) injection 250 mg (250 mg Intramuscular Given 06/04/17 1556)  lidocaine (PF) (XYLOCAINE) 1 % injection (0.9 mLs  Given 06/04/17 1556)     Initial Impression / Assessment and Plan / ED Course  I have reviewed the triage vital signs and the nursing  notes.  Pertinent labs & imaging results that were available during my care of the patient were reviewed by me and considered in my medical decision making (see chart for details).  Patient presents with penile discharge and suspected exposure to STD. He is nontoxic appearing, vitals WNL other than BP somewhat elevated- no indication of HTN emergency. Patient is afebrile without abdominal tenderness, abdominal pain or painful bowel movements to indicate prostatitis.  No tenderness to palpation of the testes or epididymis to suggest orchitis or epididymitis. UA grossly unremarkable. STD cultures obtained including HIV, syphilis, gonorrhea and chlamydia. Patient treated empirically for GC/Chlamydia with Azithromycin and Rocephin. Patient understands that they have GC/Chlamydia, syphilis, and HIV lab-work pending and that they will need to inform all sexual partners if results return positive. Discussed importance of using protection when sexually active. I discussed results, treatment plan, need for PCP or health department follow-up, and return precautions with the patient. Provided opportunity for questions, patient confirmed understanding and is in agreement with plan.    Final Clinical Impressions(s) / ED Diagnoses   Final diagnoses:  Penile discharge  STD exposure    ED Discharge Orders    None       Cherly Anderson, PA-C 06/04/17 1804    Terrilee Files, MD 06/05/17 [phone]

## 2017-06-05 LAB — HIV ANTIBODY (ROUTINE TESTING W REFLEX): HIV Screen 4th Generation wRfx: NONREACTIVE

## 2017-06-07 LAB — GC/CHLAMYDIA PROBE AMP (~~LOC~~) NOT AT ARMC
Chlamydia: NEGATIVE
Neisseria Gonorrhea: POSITIVE — AB

## 2017-06-21 ENCOUNTER — Other Ambulatory Visit: Payer: Self-pay | Admitting: Orthopedic Surgery

## 2017-06-29 NOTE — Pre-Procedure Instructions (Signed)
[facility]  06/29/2017      [facility] [address], [place] - [id number] [place] [id number] Phone: [phone] Fax: [phone]    Your procedure is scheduled on Thurs. March 7  [facility] 10:45 A.M.  Call this number if you have problems the morning of surgery:  [phone]   Remember:  Do not eat food or drink liquids after midnight on wed. March 6   Take these medicines the morning of surgery with A SIP OF WATER : none              7 days prior to surgery STOP taking any Aspirin(unless otherwise instructed by your surgeon), Aleve, Naproxen, Ibuprofen, Motrin, Advil, Goody's, BC's, all herbal medications, fish oil, and all vitamins   Do not wear jewelry.  Do not wear lotions, powders, or perfumes, or deodorant.  Do not shave 48 hours prior to surgery.  Men may shave face and neck.  Do not bring valuables to the hospital.  [facility] is not responsible for any belongings or valuables.  Contacts, dentures or bridgework may not be worn into surgery.  Leave your suitcase in the car.  After surgery it may be brought to your room.  For patients admitted to the hospital, discharge time will be determined by your treatment team.  Patients discharged the day of surgery will not be allowed to drive home.    Special instructions:  [place]- Preparing For Surgery  Before surgery, you can play an important role. Because skin is not sterile, your skin needs to be as free of germs as possible. You can reduce the number of germs on your skin by washing with CHG (chlorahexidine gluconate) Soap before surgery.  CHG is an antiseptic cleaner which kills germs and bonds with the skin to continue killing germs even after washing.  Please do not use if you have an allergy to CHG or antibacterial soaps. If your skin becomes reddened/irritated stop using the CHG.  Do not shave (including legs and  underarms) for at least 48 hours prior to first CHG shower. It is OK to shave your face.  Please follow these instructions carefully.   1. Shower the NIGHT BEFORE SURGERY and the MORNING OF SURGERY with CHG.   2. If you chose to wash your hair, wash your hair first as usual with your normal shampoo.  3. After you shampoo, rinse your hair and body thoroughly to remove the shampoo.  4. Use CHG as you would any other liquid soap. You can apply CHG directly to the skin and wash gently with a scrungie or a clean washcloth.   5. Apply the CHG Soap to your body ONLY FROM THE NECK DOWN.  Do not use on open wounds or open sores. Avoid contact with your eyes, ears, mouth and genitals (private parts). Wash Face and genitals (private parts)  with your normal soap.  6. Wash thoroughly, paying special attention to the area where your surgery will be performed.  7. Thoroughly rinse your body with warm water from the neck down.  8. DO NOT shower/wash with your normal soap after using and rinsing off the CHG Soap.  9. Pat yourself dry with a CLEAN TOWEL.  10. Wear CLEAN PAJAMAS to bed the night before surgery, wear comfortable clothes the morning of surgery  11. Place CLEAN SHEETS on your bed the night of your first shower and DO  NOT SLEEP WITH PETS.    Day of Surgery: Do not apply any deodorants/lotions. Please wear clean clothes to the hospital/surgery center.      Please read over the following fact sheets that you were given. Coughing and Deep Breathing and Surgical Site Infection Prevention

## 2017-06-30 ENCOUNTER — Encounter (HOSPITAL_COMMUNITY)
Admission: RE | Admit: 2017-06-30 | Discharge: 2017-06-30 | Disposition: A | Discharge status: Home / Self Care | Payer: Medicaid Other | Source: Ambulatory Visit | Attending: Orthopedic Surgery | Admitting: Orthopedic Surgery

## 2017-06-30 ENCOUNTER — Encounter (HOSPITAL_COMMUNITY): Payer: Self-pay

## 2017-06-30 ENCOUNTER — Other Ambulatory Visit: Payer: Self-pay

## 2017-06-30 DIAGNOSIS — Z01818 Encounter for other preprocedural examination: Secondary | ICD-10-CM | POA: Insufficient documentation

## 2017-06-30 DIAGNOSIS — S43004A Unspecified dislocation of right shoulder joint, initial encounter: Secondary | ICD-10-CM | POA: Diagnosis not present

## 2017-06-30 DIAGNOSIS — X58XXXA Exposure to other specified factors, initial encounter: Secondary | ICD-10-CM | POA: Insufficient documentation

## 2017-06-30 LAB — HEMOGLOBIN: Hemoglobin: 14.4 g/dL (ref 13.0–17.0)

## 2017-06-30 NOTE — Progress Notes (Signed)
PCP: none 

## 2017-07-07 MED ORDER — CEFAZOLIN SODIUM-DEXTROSE 2-4 GM/100ML-% IV SOLN: 2.0000 g | INTRAVENOUS | Status: AC

## 2017-07-14 ENCOUNTER — Encounter (HOSPITAL_COMMUNITY): Payer: Self-pay | Admitting: *Deleted

## 2017-07-14 ENCOUNTER — Other Ambulatory Visit: Payer: Self-pay

## 2017-07-14 NOTE — Progress Notes (Signed)
Spoke with pt for pre-op call. Pt was seen here in February, surgery was rescheduled. Pt verifies that allergies, medications, medical and surgical history has not changed since he was here for his PAT appt.

## 2017-07-15 ENCOUNTER — Ambulatory Visit (HOSPITAL_COMMUNITY)
Admission: RE | Admit: 2017-07-15 | Discharge: 2017-07-15 | Disposition: A | Discharge status: Home / Self Care | Payer: Medicaid Other | Source: Ambulatory Visit | Attending: Orthopedic Surgery | Admitting: Orthopedic Surgery

## 2017-07-15 ENCOUNTER — Encounter (HOSPITAL_COMMUNITY)
Admission: RE | Disposition: A | Discharge status: Home / Self Care | Payer: Self-pay | Source: Ambulatory Visit | Attending: Orthopedic Surgery

## 2017-07-15 ENCOUNTER — Encounter (HOSPITAL_COMMUNITY): Payer: Self-pay | Admitting: Certified Registered Nurse Anesthetist

## 2017-07-15 ENCOUNTER — Ambulatory Visit (HOSPITAL_COMMUNITY): Payer: Medicaid Other | Admitting: Vascular Surgery

## 2017-07-15 DIAGNOSIS — M85811 Other specified disorders of bone density and structure, right shoulder: Secondary | ICD-10-CM | POA: Diagnosis not present

## 2017-07-15 DIAGNOSIS — M24411 Recurrent dislocation, right shoulder: Secondary | ICD-10-CM | POA: Diagnosis not present

## 2017-07-15 DIAGNOSIS — Z87891 Personal history of nicotine dependence: Secondary | ICD-10-CM | POA: Insufficient documentation

## 2017-07-15 HISTORY — DX: Other specified health status: Z78.9

## 2017-07-15 HISTORY — PX: SHOULDER LATERJET: SHX6528

## 2017-07-15 SURGERY — REPAIR, SHOULDER, LATARJET
Anesthesia: General | Site: Shoulder | Laterality: Right

## 2017-07-15 MED ORDER — LIDOCAINE HCL (CARDIAC) 20 MG/ML IV SOLN
INTRAVENOUS | Status: AC
  Filled 2017-07-15: qty 5

## 2017-07-15 MED ORDER — FENTANYL CITRATE (PF) 100 MCG/2ML IJ SOLN
100.0000 ug | Freq: Once | INTRAMUSCULAR | Status: AC
  Administered 2017-07-15: 100 ug via INTRAVENOUS

## 2017-07-15 MED ORDER — POVIDONE-IODINE 7.5 % EX SOLN
Freq: Once | CUTANEOUS | Status: DC
  Filled 2017-07-15: qty 118

## 2017-07-15 MED ORDER — DEXAMETHASONE SODIUM PHOSPHATE 10 MG/ML IJ SOLN
INTRAMUSCULAR | Status: DC | PRN
  Administered 2017-07-15: 10 mg via INTRAVENOUS

## 2017-07-15 MED ORDER — ROCURONIUM BROMIDE 50 MG/5ML IV SOSY
PREFILLED_SYRINGE | INTRAVENOUS | Status: DC | PRN
  Administered 2017-07-15: 60 mg via INTRAVENOUS

## 2017-07-15 MED ORDER — MIDAZOLAM HCL 2 MG/2ML IJ SOLN
4.0000 mg | Freq: Once | INTRAMUSCULAR | Status: AC
  Administered 2017-07-15: 4 mg via INTRAVENOUS

## 2017-07-15 MED ORDER — MIDAZOLAM HCL 2 MG/2ML IJ SOLN
INTRAMUSCULAR | Status: AC
  Administered 2017-07-15: 4 mg via INTRAVENOUS
  Filled 2017-07-15: qty 2

## 2017-07-15 MED ORDER — ONDANSETRON HCL 4 MG/2ML IJ SOLN
INTRAMUSCULAR | Status: DC | PRN
  Administered 2017-07-15: 4 mg via INTRAVENOUS

## 2017-07-15 MED ORDER — PROPOFOL 10 MG/ML IV BOLUS
INTRAVENOUS | Status: DC | PRN
  Administered 2017-07-15: 200 mg via INTRAVENOUS

## 2017-07-15 MED ORDER — DEXMEDETOMIDINE HCL IN NACL 200 MCG/50ML IV SOLN
INTRAVENOUS | Status: AC
  Filled 2017-07-15: qty 50

## 2017-07-15 MED ORDER — MIDAZOLAM HCL 2 MG/2ML IJ SOLN
INTRAMUSCULAR | Status: AC
  Filled 2017-07-15: qty 2

## 2017-07-15 MED ORDER — FENTANYL CITRATE (PF) 100 MCG/2ML IJ SOLN
INTRAMUSCULAR | Status: DC | PRN
  Administered 2017-07-15: 50 ug via INTRAVENOUS

## 2017-07-15 MED ORDER — SUGAMMADEX SODIUM 200 MG/2ML IV SOLN
INTRAVENOUS | Status: DC | PRN
  Administered 2017-07-15: 200 mg via INTRAVENOUS

## 2017-07-15 MED ORDER — ONDANSETRON HCL 4 MG/2ML IJ SOLN
INTRAMUSCULAR | Status: AC
  Filled 2017-07-15: qty 2

## 2017-07-15 MED ORDER — SODIUM CHLORIDE 0.9 % IR SOLN
Status: DC | PRN
  Administered 2017-07-15: 1000 mL

## 2017-07-15 MED ORDER — FENTANYL CITRATE (PF) 100 MCG/2ML IJ SOLN: 25.0000 ug | INTRAMUSCULAR | Status: DC | PRN

## 2017-07-15 MED ORDER — DEXAMETHASONE SODIUM PHOSPHATE 10 MG/ML IJ SOLN
INTRAMUSCULAR | Status: AC
  Filled 2017-07-15: qty 1

## 2017-07-15 MED ORDER — DEXMEDETOMIDINE HCL IN NACL 200 MCG/50ML IV SOLN
INTRAVENOUS | Status: DC | PRN
  Administered 2017-07-15: 20 ug via INTRAVENOUS
  Administered 2017-07-15: 28 ug via INTRAVENOUS

## 2017-07-15 MED ORDER — PROMETHAZINE HCL 25 MG/ML IJ SOLN: 6.2500 mg | INTRAMUSCULAR | Status: DC | PRN

## 2017-07-15 MED ORDER — BUPIVACAINE LIPOSOME 1.3 % IJ SUSP
INTRAMUSCULAR | Status: DC | PRN
  Administered 2017-07-15: 10 mL via PERINEURAL

## 2017-07-15 MED ORDER — ROCURONIUM BROMIDE 10 MG/ML (PF) SYRINGE
PREFILLED_SYRINGE | INTRAVENOUS | Status: AC
  Filled 2017-07-15: qty 5

## 2017-07-15 MED ORDER — FENTANYL CITRATE (PF) 250 MCG/5ML IJ SOLN
INTRAMUSCULAR | Status: AC
  Filled 2017-07-15: qty 5

## 2017-07-15 MED ORDER — CEFAZOLIN SODIUM-DEXTROSE 2-4 GM/100ML-% IV SOLN
2.0000 g | Freq: Once | INTRAVENOUS | Status: AC
  Administered 2017-07-15: 2 g via INTRAVENOUS
  Filled 2017-07-15: qty 100

## 2017-07-15 MED ORDER — FENTANYL CITRATE (PF) 100 MCG/2ML IJ SOLN
INTRAMUSCULAR | Status: AC
  Administered 2017-07-15: 100 ug via INTRAVENOUS
  Filled 2017-07-15: qty 2

## 2017-07-15 MED ORDER — LIDOCAINE HCL (PF) 1 % IJ SOLN
INTRAMUSCULAR | Status: AC
  Filled 2017-07-15: qty 30

## 2017-07-15 MED ORDER — BUPIVACAINE HCL (PF) 0.5 % IJ SOLN
INTRAMUSCULAR | Status: DC | PRN
  Administered 2017-07-15: 15 mL via PERINEURAL

## 2017-07-15 MED ORDER — LIDOCAINE 2% (20 MG/ML) 5 ML SYRINGE
INTRAMUSCULAR | Status: DC | PRN
  Administered 2017-07-15: 20 mg via INTRAVENOUS

## 2017-07-15 MED ORDER — PROPOFOL 10 MG/ML IV BOLUS
INTRAVENOUS | Status: AC
  Filled 2017-07-15: qty 20

## 2017-07-15 MED ORDER — LACTATED RINGERS IV SOLN
Freq: Once | INTRAVENOUS | Status: AC
  Administered 2017-07-15: 08:00:00 via INTRAVENOUS

## 2017-07-15 MED ORDER — SUGAMMADEX SODIUM 200 MG/2ML IV SOLN
INTRAVENOUS | Status: AC
  Filled 2017-07-15: qty 2

## 2017-07-15 MED ORDER — FENTANYL CITRATE (PF) 100 MCG/2ML IJ SOLN
INTRAMUSCULAR | Status: AC
  Filled 2017-07-15: qty 2

## 2017-07-15 SURGICAL SUPPLY — 54 items
BIT DRILL Q/COUPLING 1 (BIT) ×2 IMPLANT
BLADE LONG MED 31MMX9MM (MISCELLANEOUS) ×2
BLADE LONG MED 31X9 (MISCELLANEOUS) ×2 IMPLANT
BLADE SURG 10 STRL SS (BLADE) ×2 IMPLANT
BUR EGG ELITE 4.0 (BURR) ×1 IMPLANT
BUR EGG ELITE 4.0MM (BURR) ×1
CHLORAPREP W/TINT 26ML (MISCELLANEOUS) ×3 IMPLANT
CLOSURE STERI-STRIP 1/2X4 (GAUZE/BANDAGES/DRESSINGS) ×1
CLSR STERI-STRIP ANTIMIC 1/2X4 (GAUZE/BANDAGES/DRESSINGS) ×1 IMPLANT
COVER SURGICAL LIGHT HANDLE (MISCELLANEOUS) ×3 IMPLANT
DRAPE INCISE IOBAN 66X45 STRL (DRAPES) ×3 IMPLANT
DRAPE SURG 17X23 STRL (DRAPES) ×3 IMPLANT
DRAPE U-SHAPE 47X51 STRL (DRAPES) ×3 IMPLANT
DRESSING AQUACEL AG SP 3.5X10 (GAUZE/BANDAGES/DRESSINGS) IMPLANT
DRSG AQUACEL AG SP 3.5X10 (GAUZE/BANDAGES/DRESSINGS) ×3
DRSG EMULSION OIL 3X3 NADH (GAUZE/BANDAGES/DRESSINGS) ×6 IMPLANT
DRSG PAD ABDOMINAL 8X10 ST (GAUZE/BANDAGES/DRESSINGS) ×6 IMPLANT
ELECT BLADE 4.0 EZ CLEAN MEGAD (MISCELLANEOUS) ×3
ELECT REM PT RETURN 9FT ADLT (ELECTROSURGICAL)
ELECTRODE BLDE 4.0 EZ CLN MEGD (MISCELLANEOUS) IMPLANT
ELECTRODE REM PT RTRN 9FT ADLT (ELECTROSURGICAL) IMPLANT
GAUZE SPONGE 4X4 12PLY STRL (GAUZE/BANDAGES/DRESSINGS) ×3 IMPLANT
GAUZE SPONGE 4X4 16PLY XRAY LF (GAUZE/BANDAGES/DRESSINGS) ×2 IMPLANT
GAUZE XEROFORM 1X8 LF (GAUZE/BANDAGES/DRESSINGS) ×3 IMPLANT
GLOVE BIO SURGEON STRL SZ7.5 (GLOVE) ×3 IMPLANT
GLOVE BIOGEL PI IND STRL 8 (GLOVE) ×1 IMPLANT
GLOVE BIOGEL PI INDICATOR 8 (GLOVE) ×2
GLOVE SURG SS PI 6.5 STRL IVOR (GLOVE) ×2 IMPLANT
GOWN STRL REUS W/ TWL LRG LVL3 (GOWN DISPOSABLE) ×3 IMPLANT
GOWN STRL REUS W/TWL LRG LVL3 (GOWN DISPOSABLE) ×9
GOWN STRL REUS W/TWL XL LVL3 (GOWN DISPOSABLE) ×2 IMPLANT
GUIDEWIRE 2.0MM (WIRE) ×2 IMPLANT
KIT BASIN OR (CUSTOM PROCEDURE TRAY) ×3 IMPLANT
KIT ROOM TURNOVER OR (KITS) ×3 IMPLANT
MANIFOLD NEPTUNE II (INSTRUMENTS) ×3 IMPLANT
NS IRRIG 1000ML POUR BTL (IV SOLUTION) ×3 IMPLANT
PACK SHOULDER (CUSTOM PROCEDURE TRAY) ×3 IMPLANT
PAD ARMBOARD 7.5X6 YLW CONV (MISCELLANEOUS) ×6 IMPLANT
SCREW MALLEO CANCELLOUS 35MM (Screw) ×4 IMPLANT
SLING ARM FOAM STRAP LRG (SOFTGOODS) ×3 IMPLANT
SLING ARM IMMOBILIZER LRG (SOFTGOODS) ×2 IMPLANT
SPONGE LAP 4X18 X RAY DECT (DISPOSABLE) IMPLANT
SUPPORT WRAP ARM LG (MISCELLANEOUS) ×2 IMPLANT
SUT BONE WAX W31G (SUTURE) ×2 IMPLANT
SUT ETHIBOND NAB CT1 #1 30IN (SUTURE) ×2 IMPLANT
SUT FIBERWIRE #2 38 T-5 BLUE (SUTURE) ×3
SUT VIC AB 2-0 CT1 27 (SUTURE) ×3
SUT VIC AB 2-0 CT1 TAPERPNT 27 (SUTURE) IMPLANT
SUTURE FIBERWR #2 38 T-5 BLUE (SUTURE) IMPLANT
SYR CONTROL 10ML LL (SYRINGE) ×1 IMPLANT
TOWEL OR 17X24 6PK STRL BLUE (TOWEL DISPOSABLE) ×3 IMPLANT
TOWEL OR 17X26 10 PK STRL BLUE (TOWEL DISPOSABLE) ×3 IMPLANT
TUBE CONNECTING 12'X1/4 (SUCTIONS)
TUBE CONNECTING 12X1/4 (SUCTIONS) ×1 IMPLANT

## 2017-07-15 NOTE — Transfer of Care (Signed)
Immediate Anesthesia Transfer of Care Note  Patient: Alex Quinn  Procedure(s) Performed: SHOULDER LATERJET (Right Shoulder)  Patient Location: PACU  Anesthesia Type:GA combined with regional for post-op pain  Level of Consciousness: drowsy and patient cooperative  Airway & Oxygen Therapy: Patient Spontanous Breathing and Patient connected to face mask oxygen  Post-op Assessment: Report given to RN and Post -op Vital signs reviewed and stable  Post vital signs: Reviewed and stable  Last Vitals:  Vitals:   07/15/17 0945 07/15/17 0950  BP: 131/78 126/81  Pulse: (!) 50 (!) 57  Resp: 15 14  Temp:    SpO2: 99% 99%    Last Pain:  Vitals:   07/15/17 0940  TempSrc:   PainSc: 0-No pain      Patients Stated Pain Goal: 0 (07/15/17 0804)  Complications: No apparent anesthesia complications

## 2017-07-15 NOTE — H&P (Signed)
Mackson L Laural BenesJohnson is an 37 y.o. male.   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  He is now dislocating in his sleep and with minimal normal activities of daily living.  Indicated for surgical treatment to restore stability of the shoulder.  Past Medical History:  Diagnosis Date  . Dislocated shoulder   . Medical history non-contributory     Past Surgical History:  Procedure Laterality Date  . MANDIBLE FRACTURE SURGERY    . ROTATOR CUFF REPAIR     manibulation    History reviewed. No pertinent family history. Social History:  reports that he quit smoking about 12 months ago. His smoking use included cigarettes. he has never used smokeless tobacco. He reports that he does not drink alcohol or use drugs.  Allergies: No Known Allergies  Medications Prior to Admission  Medication Sig Dispense Refill  . ibuprofen (ADVIL,MOTRIN) 800 MG tablet Take 1 tablet (800 mg total) by mouth 3 (three) times daily. (Patient taking differently: Take 800 mg by mouth daily as needed for headache or moderate pain. ) 21 tablet 0  . methocarbamol (ROBAXIN) 500 MG tablet Take 1 tablet (500 mg total) by mouth 2 (two) times daily. (Patient not taking: Reported on 06/22/2017) 20 tablet 0    No results found for this or any previous visit (from the past 48 hour(s)). No results found.  Review of Systems  All other systems reviewed and are negative.   Blood pressure 126/81, pulse (!) 57, temperature (!) 97.4 F (36.3 C), temperature source Oral, resp. rate 14, height 6' (1.829 m), weight 101.6 kg (223 lb 14.4 oz), SpO2 99 %. Physical Exam  Constitutional: He is oriented to person, place, and time. He appears well-developed and well-nourished.  HENT:  Head: Atraumatic.  Eyes: EOM are normal.  Cardiovascular: Intact distal pulses.  Respiratory: Effort normal.  Musculoskeletal:  R shoulder apprehension with  abduction and external rotation  Neurological: He is alert and oriented to person, place, and time.  Skin: Skin is warm and dry.  Psychiatric: He has a normal mood and affect.     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  He is now dislocating in his sleep and with minimal normal activities of daily living.  Indicated for surgical treatment to restore stability of the shoulder. Plan right shoulder coracoid transfer Risks / benefits of surgery discussed Consent on chart  NPO for OR Preop antibiotics   Berline LopesJustin W Zyheir Daft, MD 07/15/2017, 12:05 PM

## 2017-07-15 NOTE — Anesthesia Postprocedure Evaluation (Signed)
Anesthesia Post Note  Patient: Ricard Cyndie MullL Whitefield  Procedure(s) Performed: SHOULDER LATERJET (Right Shoulder)     Patient location during evaluation: PACU Anesthesia Type: General Level of consciousness: awake and alert Pain management: pain level controlled Vital Signs Assessment: post-procedure vital signs reviewed and stable Respiratory status: spontaneous breathing, nonlabored ventilation, respiratory function stable and patient connected to nasal cannula oxygen Cardiovascular status: blood pressure returned to baseline and stable Postop Assessment: no apparent nausea or vomiting Anesthetic complications: no    Last Vitals:  Vitals:   07/15/17 1247 07/15/17 1315  BP: 122/84   Pulse: 63   Resp: 17   Temp:  (!) 36.4 C  SpO2: 94%     Last Pain:  Vitals:   07/15/17 1300  TempSrc:   PainSc: 0-No pain                 Kennieth RadFitzgerald, Eara Burruel E

## 2017-07-15 NOTE — Anesthesia Procedure Notes (Signed)
Anesthesia Regional Block: Interscalene brachial plexus block   Pre-Anesthetic Checklist: ,, timeout performed, Correct Patient, Correct Site, Correct Laterality, Correct Procedure, Correct Position, site marked, Risks and benefits discussed,  Surgical consent,  Pre-op evaluation,  At surgeon's request and post-op pain management  Laterality: Right  Prep: chloraprep       Needles:  Injection technique: Single-shot  Needle Type: Echogenic Needle     Needle Length: 9cm  Needle Gauge: 21     Additional Needles:   Procedures:, nerve stimulator,,, ultrasound used (permanent image in chart),,,,   Nerve Stimulator or Paresthesia:  Response: deltoid, 0.5 mA,   Additional Responses:   Narrative:  Start time: 07/15/2017 9:26 AM End time: 07/15/2017 9:36 AM Injection made incrementally with aspirations every 5 mL.  Performed by: Personally  Anesthesiologist: Marcene DuosFitzgerald, Juhi Lagrange, MD

## 2017-07-15 NOTE — Anesthesia Procedure Notes (Signed)
Procedure Name: Intubation Date/Time: 07/15/2017 10:22 AM Performed by: Pearson Grippeobertson, Tonimarie Gritz M, CRNA Pre-anesthesia Checklist: Patient identified, Emergency Drugs available, Suction available and Patient being monitored Patient Re-evaluated:Patient Re-evaluated prior to induction Oxygen Delivery Method: Circle system utilized Preoxygenation: Pre-oxygenation with 100% oxygen Induction Type: IV induction Ventilation: Mask ventilation without difficulty Laryngoscope Size: Miller and 2 Grade View: Grade I Tube type: Oral Tube size: 7.5 mm Number of attempts: 1 Airway Equipment and Method: Stylet and Oral airway Placement Confirmation: ETT inserted through vocal cords under direct vision,  positive ETCO2 and breath sounds checked- equal and bilateral Secured at: 23 cm Tube secured with: Tape Dental Injury: Teeth and Oropharynx as per pre-operative assessment

## 2017-07-15 NOTE — Anesthesia Preprocedure Evaluation (Signed)
Anesthesia Evaluation  Patient identified by MRN, date of birth, ID band Patient awake    Reviewed: Allergy & Precautions, NPO status , Patient's Chart, lab work & pertinent test results  Airway Mallampati: II  TM Distance: >3 FB Neck ROM: Full    Dental  (+) Dental Advisory Given   Pulmonary former smoker,    breath sounds clear to auscultation       Cardiovascular negative cardio ROS   Rhythm:Regular Rate:Normal     Neuro/Psych negative neurological ROS     GI/Hepatic negative GI ROS, Neg liver ROS,   Endo/Other  negative endocrine ROS  Renal/GU negative Renal ROS     Musculoskeletal   Abdominal   Peds  Hematology   Anesthesia Other Findings   Reproductive/Obstetrics                             Lab Results  Component Value Date   WBC 6.2 06/06/2015   HGB 14.4 06/30/2017   HCT 39.0 06/06/2015   MCV 83.5 06/06/2015   PLT 155 06/06/2015   Lab Results  Component Value Date   CREATININE 0.95 06/06/2015   BUN 11 06/06/2015   NA 141 06/06/2015   K 3.8 06/06/2015   CL 108 06/06/2015   CO2 26 06/06/2015    Anesthesia Physical Anesthesia Plan  ASA: I  Anesthesia Plan: General   Post-op Pain Management:  Regional for Post-op pain   Induction: Intravenous  PONV Risk Score and Plan: 2 and Ondansetron, Dexamethasone and Treatment may vary due to age or medical condition  Airway Management Planned: Oral ETT  Additional Equipment:   Intra-op Plan:   Post-operative Plan: Extubation in OR  Informed Consent: I have reviewed the patients History and Physical, chart, labs and discussed the procedure including the risks, benefits and alternatives for the proposed anesthesia with the patient or authorized representative who has indicated his/her understanding and acceptance.   Dental advisory given  Plan Discussed with: CRNA  Anesthesia Plan Comments:         Anesthesia  Quick Evaluation

## 2017-07-15 NOTE — Op Note (Signed)
Procedure(s): SHOULDER LATERJET Procedure Note  Alex Quinn male 37 y.o. 07/15/2017  Procedure(s) and Anesthesia Type:    * SHOULDER LATERJET - General  Surgeon(s) and Role:    Alex Quinn* Alex Reihl, MD - Primary   Indications:  37 y.o. male with right recurrent shoulder instability. Significant bone loss precludes arthroscopic management. Indicated for surgical treatment to restore stability.     Surgeon: Alex Quinn   Assistants: None  Anesthesia: General endotracheal anesthesia with preoperative interscalene block given by the attending anesthesiologist    Procedure Detail  SHOULDER LATERJET  Findings: He had significant anterior glenoid bone loss.  The coracoid was transferred with 2 4.5 mm partially threaded screws with excellent fixation.  Estimated Blood Loss:  less than 50 mL         Drains: none  Blood Given: none         Specimens: none        Complications:  * No complications entered in OR log *         Disposition: PACU - hemodynamically stable.         Condition: stable    Procedure:  DESCRIPTION OF PROCEDURE: The patient was identified in preoperative  holding area where I personally marked the operative site after  verifying site, side, and procedure with the patient. The patient was taken back  to the operating room where general anesthesia was induced without  complication and was placed in the beach-chair position with the back  elevated about 30 degrees and all extremities carefully padded and  positioned. The right upper extremity was then prepped and  draped in a standard sterile fashion. The appropriate time-out  procedure was carried out. The patient did receive IV antibiotics  within 30 minutes of incision.  An incision was made from the palpable tip of the coracoid distally approximately 6-7 cm. Dissection was carried down through subcutaneous tissues creating skin flaps medial and lateral. The deltopectoral interval was  identified and the cephalic vein was taken laterally with the deltoid. The underlying conjoined tendon was identified and retractor was placed above the coracoid. The lateral edge of the conjoined tendon was developed and the coracoacromial ligament was split leaving some tendon still attached to the coracoid. The pectoralis minor was then taken off the medial aspect of the coracoid. The dorsal aspect was exposed to the base of the coracoclavicular ligaments. A right angled saw was then used from medial to lateral to come through the base of the coracoid and a curved osteotome was used to complete the cut. A nice piece just over 2 cm was taken. The undersurface was cleaned of soft tissues and then decorticated with a saw and bur. The 3.2 mm drill was then used to create 2 drill holes separated by about 8 mm. The coracoid piece was then placed under the pectoralis major medially. The arm was externally rotated exposing the muscular portion of the subscapularis. Curved Mayo scissors were then used to open the interval between the upper two thirds and lower one third of the subscapularis. The underlying capsule was identified. Small sponge and retractor was placed medially. A blunt retractor was placed inferiorly outside the capsule. Capsule was then sharply opened at the glenohumeral joint. A Steinmann pin was placed superiorly on the scapular neck to retract the upper subscapularis. A portion of the anterior medial capsule and labrum was then excised. There was a bony Bankart fragment which was incompletely healed. This was removed completely with a rongeur.  The flat surface of the anterior glenoid was then prepared down to bleeding bone to accept the graft. The coracoid graft was then brought back into the field and careful dissection was made medial and lateral to allow enough excursion. The musculocutaneous nerve was identified and protected. Once it was confirmed that the graft would fit nicely in its position  the 3.2 mm drill was then used at about the 5:00 position on the glenoid parallel to the articular surface about 7 mm medial. A 35 mm 4.5 malleolar screw was then used to affix the lower portion of the graft. The upper hole was then drilled and measured bicortically. A 35 mm screw was placed here with excellent fixation. The graft was noted to be in a very good position with about a mm of  lateral overhang. A burr was used to smooth this The subscapularis was allowed to close back on itself but no formal repair was necessary here. The deltopectoral interval also closed on itself nicely with no repair necessary. Copious irrigation was again used. Skin was then closed in layers with 2-0 Vicryl and 4-0 Monocryl. Steri-Strips were applied.    Sterile dressings were applied including a medium  Mepilex dressing. The patient was then allowed to awaken from general  anesthesia, placed in a sling, transferred to stretcher and taken to the  recovery room in stable condition.   POSTOPERATIVE PLAN: He will be observed in the recovery room today, but likely can be discharged home with family. Follow-up will be in 2 weeks.

## 2017-07-15 NOTE — Discharge Instructions (Addendum)
Keep dressing c/d/i  5 days, then ok to remove, but do not remove steri strips, Ok to shower normally after 5 days.  Pat dry.  Keep arm in sling.  Wiggle fingers.    Post Anesthesia Home Care Instructions  Activity: Get plenty of rest for the remainder of the day. A responsible individual must stay with you for 24 hours following the procedure.  For the next 24 hours, DO NOT: -Drive a car -Advertising copywriterperate machinery -Drink alcoholic beverages -Take any medication unless instructed by your physician -Make any legal decisions or sign important papers.  Meals: Start with liquid foods such as gelatin or soup. Progress to regular foods as tolerated. Avoid greasy, spicy, heavy foods. If nausea and/or vomiting occur, drink only clear liquids until the nausea and/or vomiting subsides. Call your physician if vomiting continues.  Special Instructions/Symptoms: Your throat may feel dry or sore from the anesthesia or the breathing tube placed in your throat during surgery. If this causes discomfort, gargle with warm salt water. The discomfort should disappear within 24 hours.  If you had a scopolamine patch placed behind your ear for the management of post- operative nausea and/or vomiting:  1. The medication in the patch is effective for 72 hours, after which it should be removed.  Wrap patch in a tissue and discard in the trash. Wash hands thoroughly with soap and water. 2. You may remove the patch earlier than 72 hours if you experience unpleasant side effects which may include dry mouth, dizziness or visual disturbances. 3. Avoid touching the patch. Wash your hands with soap and water after contact with the patch.   Regional Anesthesia Blocks  1. Numbness or the inability to move the "blocked" extremity may last from 3-48 hours after placement. The length of time depends on the medication injected and your individual response to the medication. If the numbness is not going away after 48 hours, call your  surgeon.  2. The extremity that is blocked will need to be protected until the numbness is gone and the  Strength has returned. Because you cannot feel it, you will need to take extra care to avoid injury. Because it may be weak, you may have difficulty moving it or using it. You may not know what position it is in without looking at it while the block is in effect.  3. For blocks in the legs and feet, returning to weight bearing and walking needs to be done carefully. You will need to wait until the numbness is entirely gone and the strength has returned. You should be able to move your leg and foot normally before you try and bear weight or walk. You will need someone to be with you when you first try to ensure you do not fall and possibly risk injury.  4. Bruising and tenderness at the needle site are common side effects and will resolve in a few days.  5. Persistent numbness or new problems with movement should be communicated to the surgeon or the [facility] [phone])/ [facility] [phone]).

## 2017-07-15 NOTE — Progress Notes (Signed)
Spoke with Dr. Sampson GoonFitzgerald, no need for additional lab work.

## 2017-07-16 ENCOUNTER — Encounter (HOSPITAL_COMMUNITY): Payer: Self-pay | Admitting: Orthopedic Surgery

## 2017-09-08 ENCOUNTER — Ambulatory Visit: Payer: Medicaid Other | Attending: Orthopedic Surgery | Admitting: Physical Therapy

## 2017-09-08 ENCOUNTER — Other Ambulatory Visit: Payer: Self-pay

## 2017-09-08 ENCOUNTER — Encounter: Payer: Self-pay | Admitting: Physical Therapy

## 2017-09-08 DIAGNOSIS — M25611 Stiffness of right shoulder, not elsewhere classified: Secondary | ICD-10-CM

## 2017-09-08 DIAGNOSIS — G8929 Other chronic pain: Secondary | ICD-10-CM

## 2017-09-08 DIAGNOSIS — M6281 Muscle weakness (generalized): Secondary | ICD-10-CM | POA: Diagnosis present

## 2017-09-08 DIAGNOSIS — M25511 Pain in right shoulder: Secondary | ICD-10-CM | POA: Insufficient documentation

## 2017-09-08 DIAGNOSIS — R293 Abnormal posture: Secondary | ICD-10-CM

## 2017-09-08 NOTE — Patient Instructions (Signed)
   SCAPULAR RETRACTIONS  Draw your shoulder blades back and down.  Hold for 3-5 seconds.  Repeat 15-20 times, twice a day (morning and evening)    Prone Scapular Retraction - Merilyn Baba face down with a pillow under your hips and arms straight at your sides.  Raise your arms off from the ground and hold them there for the desired amount of time.  Repeat 10 times, twice a day (morning and evening)    Lay in the position above on your bed- this is stretching your shoulder and also retraining your body that it is OK to be in this position following surgery.  Stay in this position for 3-5 minutes, twice a day.

## 2017-09-08 NOTE — Therapy (Signed)
[facility] [address] [place], [place], [address] Phone: [phone]   Fax:  [phone]  Physical Therapy Evaluation  Patient Details  Name: Alex Quinn MRN: [id number] Date of Birth: October 29, 1980 Referring Provider: Jones Broom    Encounter Date: 09/08/2017  PT End of Session - 09/08/17 1728    Visit Number  1    Number of Visits  15    Date for PT Re-Evaluation  09/29/17    Authorization Type  medicaid (submitted 5/9)    Authorization Time Period  09/08/17 to 11/08/17    PT Start Time  1645    PT Stop Time  1715    PT Time Calculation (min)  30 min    Activity Tolerance  Patient tolerated treatment well    Behavior During Therapy  [facility] for tasks assessed/performed       Past Medical History:  Diagnosis Date  . Dislocated shoulder   . Medical history non-contributory     Past Surgical History:  Procedure Laterality Date  . MANDIBLE FRACTURE SURGERY    . ROTATOR CUFF REPAIR     manibulation  . SHOULDER LATERJET Right 07/15/2017   Procedure: SHOULDER LATERJET;  Surgeon: Jones Broom, MD;  Location: [facility] OR;  Service: Orthopedics;  Laterality: Right;    There were no vitals filed for this visit.   Subjective Assessment - 09/08/17 1646    Subjective  I had my shoulder surgery done in March 2019; I still feel like a twinge in my shoulder, I can tell I'm favoring the shoulder. I have mostly full ROM.     How long can you sit comfortably?  no limits     How long can you stand comfortably?  no limits     How long can you walk comfortably?  no limits     Patient Stated Goals  get back to exercise program    Currently in Pain?  Yes    Pain Score  7     Pain Location  Shoulder    Pain Orientation  Right    Pain Descriptors / Indicators  Sharp;Spasm    Pain Type  Acute pain    Pain Radiating Towards  none     Pain Onset  More than a month ago    Pain Frequency  Intermittent    Aggravating Factors   throwing  jabs     Pain Relieving Factors  hot water     Effect of Pain on Daily Activities  moderate          [facility] PT Assessment - 09/08/17 0001      Assessment   Medical Diagnosis  s/p Laterjet surgery     Referring Provider  Justin chandler     Onset Date/Surgical Date  07/15/17    Next MD Visit  June [address]th [address]     Prior Therapy  none       Precautions   Precautions  Shoulder    Precaution Comments  [facility] protocol       Restrictions   Weight Bearing Restrictions  No      Balance Screen   Has the patient fallen in the past 6 months  No    Has the patient had a decrease in activity level because of a fear of falling?   No    Is the patient reluctant to leave their home because of a fear of falling?   No  Prior Function   Level of Independence  Independent;Independent with basic ADLs;Independent with gait;Independent with transfers    Vocation  Unemployed    Leisure  basketball, sports, jogging, performs       ROM / Strength   AROM / PROM / Strength  AROM;Strength      AROM   AROM Assessment Site  Shoulder    Right/Left Shoulder  Right;Left    Right Shoulder Flexion  -- full ROM     Right Shoulder ABduction  160 Degrees    Right Shoulder Internal Rotation  -- T12     Right Shoulder External Rotation  -- T4      Strength   Strength Assessment Site  Shoulder    Right/Left Shoulder  Right;Left    Right Shoulder Flexion  5/5    Right Shoulder ABduction  5/5    Right Shoulder Internal Rotation  4/5    Right Shoulder External Rotation  4-/5                Objective measurements completed on examination: See above findings.              PT Education - 09/08/17 1727    Education provided  Yes    Education Details  shoulder surgery protocol and limitations, HEP, POC, prognosis     Person(s) Educated  Patient    Methods  Explanation;Demonstration    Comprehension  Verbalized understanding;Need further instruction;Verbal cues  required       PT Short Term Goals - 09/08/17 1731      PT SHORT TERM GOAL #1   Title  Patient to be compliant with appropriate HEP, to be updated PRN     Time  1    Period  Weeks    Status  New    Target Date  09/15/17      PT SHORT TERM GOAL #2   Title  Patient to demonstrate full R shoulder ROM in order to reduce stiffness and improve functional shoulder mobility     Time  2    Period  Weeks    Status  New    Target Date  09/22/17      PT SHORT TERM GOAL #3   Title  Patient to verbalize importance of restoring normal posture, shoulder motion, and shoulder stability in order to return to regular exercise program     Time  2    Period  Weeks    Status  New        PT Long Term Goals - 09/08/17 1732      PT LONG TERM GOAL #1   Title  Patient to demonstrate R shoulder strength as being 5/5 in order to reduce pain and assist in return to dynamic sports activities     Time  8    Period  Weeks    Status  New    Target Date  11/03/17      PT LONG TERM GOAL #2   Title  Patient to be able to reach overhead and perform ABD with ER without fear in order to show improved confidence in shoulder function and tasks     Time  8    Period  Weeks    Status  New      PT LONG TERM GOAL #3   Title  Patient to be able to maintain R shoulder stability during dynamic tasks and challenges in order to assist in return to sports based activities  Time  8    Period  Weeks    Status  New      PT LONG TERM GOAL #4   Title  Patient to be able to perform all selfcare techniques and dressing/bathing tasks without pain or difficulty in order to show resoluation of shoulder stiffness     Time  8    Period  Weeks    Status  New             Plan - 09/08/17 1729    Clinical Impression Statement  Patient arrives today approximately 8 weeks following Laterjet shoulder surgery; he reports that he does not have any known precautions from his MD. Followed [facility] protocol for  this session. Examination reveals mild shoulder ROM limitations, functional strength deficits, postural limitations, and significant fear and anxiety regarding shoulder movement due to history of significant shoulder surgery. He will benefit from skilled PT services in order to address functional deficits and return to optimal level of function following surgery.     History and Personal Factors relevant to plan of care:  recurrent dislocations, Laterjet surgery 07/15/17    Clinical Presentation  Stable    Clinical Presentation due to:  post-surgical state     Clinical Decision Making  Low    Rehab Potential  Excellent    PT Frequency  Other (comment) 3x in first 2 weeks, then 2x/week for 6 weeks     PT Duration  8 weeks    PT Treatment/Interventions  ADLs/Self Care Home Management;Biofeedback;Cryotherapy;Electrical Stimulation;Iontophoresis /ml Dexamethasone;Moist Heat;Ultrasound;Functional mobility training;Therapeutic activities;Therapeutic exercise;Balance training;Neuromuscular re-education;Patient/family education;Manual techniques;Scar mobilization;Passive range of motion;Dry needling;Taping    PT Next Visit Plan  review HEP and goals; follow [facility] protocol (8 weeks out of surgery as of 09/09/17)    PT Home Exercise Plan  Eval: scap retractions, prone Is, prone in ER rotation for stretch/to build confidence     Consulted and Agree with Plan of Care  Patient       Patient will benefit from skilled therapeutic intervention in order to improve the following deficits and impairments:  Increased fascial restricitons, Improper body mechanics, Pain, Increased muscle spasms, Postural dysfunction, Decreased range of motion, Decreased endurance, Decreased strength, Hypomobility, Impaired UE functional use, Impaired flexibility  Visit Diagnosis: Stiffness of right shoulder, not elsewhere classified - Plan: PT plan of care cert/re-cert  Chronic right shoulder pain - Plan: PT plan of care  cert/re-cert  Muscle weakness (generalized) - Plan: PT plan of care cert/re-cert  Abnormal posture - Plan: PT plan of care cert/re-cert     Problem List There are no active problems to display for this patient.   Nedra Hai PT, DPT, CBIS  Supplemental Physical Therapist [facility]   Pager [phone]   [facility] Outpatient Rehabilitation [facility] [address] [place], [place], [address] Phone: [phone]   Fax:  [phone]  Name: Alex Quinn MRN: [id number] Date of Birth: June 23, 1980

## 2017-09-15 ENCOUNTER — Encounter: Payer: Self-pay | Admitting: Physical Therapy

## 2017-09-15 ENCOUNTER — Ambulatory Visit: Payer: Medicaid Other | Admitting: Physical Therapy

## 2017-09-15 DIAGNOSIS — R293 Abnormal posture: Secondary | ICD-10-CM

## 2017-09-15 DIAGNOSIS — M25611 Stiffness of right shoulder, not elsewhere classified: Secondary | ICD-10-CM

## 2017-09-15 DIAGNOSIS — G8929 Other chronic pain: Secondary | ICD-10-CM

## 2017-09-15 DIAGNOSIS — M25511 Pain in right shoulder: Secondary | ICD-10-CM

## 2017-09-15 DIAGNOSIS — M6281 Muscle weakness (generalized): Secondary | ICD-10-CM

## 2017-09-15 NOTE — Patient Instructions (Signed)
   SUPINE FLEXION  While lying on your back with your arm at your side, slowly raise it up and forward towards overhead.   Do not use weights at home.  Repeat at least 30 times, twice a day (morning and evening)     AROM SHOULDER ABDUCTION  With your affected arm starting at your side with your thumb pointed upward, raise up your arm to the side.  DO THIS EXERCISE LAYING ON YOUR BACK.   Do not use weight at home.   Repeat 30 times, twice a day (morning and evening)    SIDELYING EXTERNAL ROTATION WITH TOWEL - ER  Lie on your side with your elbow bent to 90 degrees. Place a rolled up towel between your arm and the side your body as shown.   Squeeze your shoulder blade back and down toward your buttocks and hold that position.   Next, roll your arm upwards from your stomach area towards the ceiling while maintaining your arm against the towel and with your shoulder blade held down and back the entire time. Lower your arm and repeat.   Repeat 20 times, twice a day (morning and evening)

## 2017-09-15 NOTE — Therapy (Signed)
[facility] [address] [place], [place], [address] Phone: [phone]   Fax:  [phone]  Physical Therapy Treatment  Patient Details  Name: Alex Quinn MRN: [id number] Date of Birth: 04-27-1981 Referring Provider: Jones Broom    Encounter Date: 09/15/2017  PT End of Session - 09/15/17 0854    Visit Number  2    Number of Visits  15    Date for PT Re-Evaluation  09/29/17    Authorization Type  medicaid (submitted 5/9)    Authorization Time Period  09/08/17 to 11/08/17    Authorization - Visit Number  2    Authorization - Number of Visits  4    PT Start Time  0802    PT Stop Time  0848    PT Time Calculation (min)  46 min    Activity Tolerance  Patient tolerated treatment well    Behavior During Therapy  [facility] for tasks assessed/performed       Past Medical History:  Diagnosis Date  . Dislocated shoulder   . Medical history non-contributory     Past Surgical History:  Procedure Laterality Date  . MANDIBLE FRACTURE SURGERY    . ROTATOR CUFF REPAIR     manibulation  . SHOULDER LATERJET Right 07/15/2017   Procedure: SHOULDER LATERJET;  Surgeon: Jones Broom, MD;  Location: [facility] OR;  Service: Orthopedics;  Laterality: Right;    There were no vitals filed for this visit.  Subjective Assessment - 09/15/17 0803    Subjective  I'm feeling good, sometimes they make me nervous since I dislocated my shoulder so much before surgery and I really feel like they're getting easier and better. I was able to shoot a basketball with no problems.     Patient Stated Goals  get back to exercise program    Currently in Pain?  No/denies                       [facility] Adult PT Treatment/Exercise - 09/15/17 0001      Exercises   Exercises  Shoulder      Shoulder Exercises: Supine   External Rotation  PROM;AROM;15 reps PROM at approx 80 degrees ABD, sidelying AROM     Flexion  Strengthening;Right;20 reps;Weights  2#     ABduction  PROM;AROM;15 reps;Strengthening;Right    Shoulder ABduction Weight (lbs)  1    ABduction Limitations  initially approx 150-10 degrees PROM, able to work to full ROM with PROM and then performed AROM; 20 reps strength with 1#     Other Supine Exercises  cheset press 3# x30; serratus punches 2# 1x20    Other Supine Exercises  dynamic stabilizations 5 rounds of 30 R shoulder              PT Education - 09/15/17 0854    Education provided  Yes    Education Details  shoulder progressions today, HEP update, anatomy of surgery     Person(s) Educated  Patient    Methods  Explanation;Demonstration;Handout    Comprehension  Verbalized understanding;Need further instruction       PT Short Term Goals - 09/08/17 1731      PT SHORT TERM GOAL #1   Title  Patient to be compliant with appropriate HEP, to be updated PRN     Time  1    Period  Weeks    Status  New    Target Date  09/15/17  PT SHORT TERM GOAL #2   Title  Patient to demonstrate full R shoulder ROM in order to reduce stiffness and improve functional shoulder mobility     Time  2    Period  Weeks    Status  New    Target Date  09/22/17      PT SHORT TERM GOAL #3   Title  Patient to verbalize importance of restoring normal posture, shoulder motion, and shoulder stability in order to return to regular exercise program     Time  2    Period  Weeks    Status  New        PT Long Term Goals - 09/08/17 1732      PT LONG TERM GOAL #1   Title  Patient to demonstrate R shoulder strength as being 5/5 in order to reduce pain and assist in return to dynamic sports activities     Time  8    Period  Weeks    Status  New    Target Date  11/03/17      PT LONG TERM GOAL #2   Title  Patient to be able to reach overhead and perform ABD with ER without fear in order to show improved confidence in shoulder function and tasks     Time  8    Period  Weeks    Status  New      PT LONG TERM GOAL #3   Title   Patient to be able to maintain R shoulder stability during dynamic tasks and challenges in order to assist in return to sports based activities     Time  8    Period  Weeks    Status  New      PT LONG TERM GOAL #4   Title  Patient to be able to perform all selfcare techniques and dressing/bathing tasks without pain or difficulty in order to show resoluation of shoulder stiffness     Time  8    Period  Weeks    Status  New            Plan - 09/15/17 4098    Clinical Impression Statement  Began with PROM and AROM activities for abduction and ER of R shoulder today due to ongoing stiffness in this joint. Per [facility] protocol guidelines, patient appears to be ready for progression into phase III activities and light strengthening/increased focus on endurance based tasks for shoulder. Performed light functional strengthening through full ROM for endurance training today as well as peri-scapular challenges with and without weights. Patient with ongoing severe functional strength deficits in surgical shoulder at this point.     Rehab Potential  Excellent    PT Frequency  -- 3x in first 2 weeks, then 2x/week for 6 weeks     PT Duration  8 weeks    PT Treatment/Interventions  ADLs/Self Care Home Management;Biofeedback;Cryotherapy;Electrical Stimulation;Iontophoresis /ml Dexamethasone;Moist Heat;Ultrasound;Functional mobility training;Therapeutic activities;Therapeutic exercise;Balance training;Neuromuscular re-education;Patient/family education;Manual techniques;Scar mobilization;Passive range of motion;Dry needling;Taping    PT Next Visit Plan  continue to follow [facility] protocol (9 weeks out of surgery as of 09/15/17)    PT Home Exercise Plan  Eval: scap retractions, prone Is, prone in ER rotation for stretch/to build confidence, supine shoulder ABD/flexion, sidelying ER ROM     Consulted and Agree with Plan of Care  Patient       Patient will benefit from skilled  therapeutic intervention in order to improve  the following deficits and impairments:  Increased fascial restricitons, Improper body mechanics, Pain, Increased muscle spasms, Postural dysfunction, Decreased range of motion, Decreased endurance, Decreased strength, Hypomobility, Impaired UE functional use, Impaired flexibility  Visit Diagnosis: Stiffness of right shoulder, not elsewhere classified  Chronic right shoulder pain  Muscle weakness (generalized)  Abnormal posture     Problem List There are no active problems to display for this patient.   Nedra Hai PT, DPT, CBIS  Supplemental Physical Therapist [facility]   Pager [phone]   [facility] [place] [facility] Outpatient Rehabilitation [facility] [address] [place], [place], [address] Phone: [phone]   Fax:  [phone]  Name: Alex Quinn MRN: [id number] Date of Birth: 1980-10-20

## 2017-09-21 ENCOUNTER — Ambulatory Visit: Payer: Medicaid Other | Admitting: Physical Therapy

## 2017-09-21 ENCOUNTER — Encounter: Payer: Self-pay | Admitting: Physical Therapy

## 2017-09-21 DIAGNOSIS — M6281 Muscle weakness (generalized): Secondary | ICD-10-CM

## 2017-09-21 DIAGNOSIS — R293 Abnormal posture: Secondary | ICD-10-CM

## 2017-09-21 DIAGNOSIS — M25611 Stiffness of right shoulder, not elsewhere classified: Secondary | ICD-10-CM | POA: Diagnosis not present

## 2017-09-21 DIAGNOSIS — G8929 Other chronic pain: Secondary | ICD-10-CM

## 2017-09-21 DIAGNOSIS — M25511 Pain in right shoulder: Secondary | ICD-10-CM

## 2017-09-21 NOTE — Therapy (Signed)
[place], [place], [address] Phone: [phone]   Fax:  [phone]  Physical Therapy Treatment (Re-assess/Reauth)  Patient Details  Name: Alex Quinn MRN: [id number] Date of Birth: 06/24/1980 Referring Provider: Tania Ade    Encounter Date: 09/21/2017  PT End of Session - 09/21/17 1034    Visit Number  3    Number of Visits  15    Date for PT Re-Evaluation  10/26/17    Authorization Type  medicaid (submitted 5/9); reauth submitted 5/21    Authorization Time Period  09/08/17 to 11/08/17    Authorization - Visit Number  3    Authorization - Number of Visits  4    PT Start Time  1017 patient running late     PT Stop Time  0930    PT Time Calculation (min)  36 min    Activity Tolerance  Patient tolerated treatment well    Behavior During Therapy  [facility] for tasks assessed/performed       Past Medical History:  Diagnosis Date  . Dislocated shoulder   . Medical history non-contributory     Past Surgical History:  Procedure Laterality Date  . MANDIBLE FRACTURE SURGERY    . ROTATOR CUFF REPAIR     manibulation  . SHOULDER LATERJET Right 07/15/2017   Procedure: SHOULDER LATERJET;  Surgeon: Tania Ade, MD;  Location: [place];  Service: Orthopedics;  Laterality: Right;    There were no vitals filed for this visit.  Subjective Assessment - 09/21/17 0853    Subjective  My shoulder is feeling better, I can get my arm up overhead but sometimes it pops but it is not painful. I can reach way further behind my back and just use my shoulder more confidently than before.     Patient Stated Goals  get back to exercise program    Currently in Pain?  No/denies         [facility] PT Assessment - 09/21/17 0001      Assessment   Medical Diagnosis  s/p Laterjet surgery     Referring Provider  Tania Ade     Onset Date/Surgical Date  07/15/17    Next MD Visit  June 12th Dr. Tamera Punt     Prior  Therapy  none       Precautions   Precautions  Shoulder    Precaution Comments  [facility] [place] [facility] [place] protocol       Restrictions   Weight Bearing Restrictions  No      Balance Screen   Has the patient fallen in the past 6 months  No    Has the patient had a decrease in activity level because of a fear of falling?   No    Is the patient reluctant to leave their home because of a fear of falling?   No      Prior Function   Level of Independence  Independent;Independent with basic ADLs;Independent with gait;Independent with transfers    [place]  Unemployed    Leisure  basketball, sports, jogging, performs       AROM   Right Shoulder Flexion  180 Degrees    Right Shoulder ABduction  180 Degrees    Right Shoulder Internal Rotation  -- T7    Right Shoulder External Rotation  -- T5      Strength   Right Shoulder Flexion  5/5    Right Shoulder ABduction  5/5    Right Shoulder Internal  Rotation  4+/5    Right Shoulder External Rotation  4/5                   [facility] Adult PT Treatment/Exercise - 09/21/17 0001      Shoulder Exercises: Supine   Other Supine Exercises  dynamic stabilizations 5 rounds of 30 R shoulder       Shoulder Exercises: Seated   Flexion  Right;Strengthening;20 reps;Weights    Flexion Weight (lbs)  2    Abduction  Right;Strengthening;20 reps;Weights    ABduction Weight (lbs)  2      Shoulder Exercises: Prone   Other Prone Exercises  blackburn 6 1x10       Shoulder Exercises: Sidelying   External Rotation  Right;20 reps;Weights    External Rotation Weight (lbs)  1      Shoulder Exercises: Standing   Other Standing Exercises  body blade vertical and horizontal 60 seconds each              PT Education - 09/21/17 1033    Education provided  Yes    Education Details  exam findings, progress with PT and towards goals, POC moving forward, one week delay/return on 5/30 to allow Medicaid to process, HEP updates     Person(s) Educated  Patient     Methods  Explanation;Demonstration;Handout    Comprehension  Verbalized understanding;Returned demonstration       PT Short Term Goals - 09/21/17 0859      PT SHORT TERM GOAL #1   Title  Patient to be compliant with appropriate HEP, to be updated PRN     Baseline  5/21- compliant     Time  1    Period  Weeks    Status  Achieved      PT SHORT TERM GOAL #2   Title  Patient to demonstrate full R shoulder ROM in order to reduce stiffness and improve functional shoulder mobility     Baseline  5/21- much improved     Time  2    Period  Weeks    Status  Achieved      PT SHORT TERM GOAL #3   Title  Patient to verbalize importance of restoring normal posture, shoulder motion, and shoulder stability in order to return to regular exercise program     Baseline  5/21-  met     Time  2    Period  Weeks    Status  Achieved        PT Long Term Goals - 09/21/17 0900      PT LONG TERM GOAL #1   Title  Patient to demonstrate R shoulder strength as being 5/5 in order to reduce pain and assist in return to dynamic sports activities     Baseline  5/21- ongoing weakness especially rotator cuff muscles     Time  8    Period  Weeks    Status  On-going      PT LONG TERM GOAL #2   Title  Patient to be able to reach overhead and perform ABD with ER without fear in order to show improved confidence in shoulder function and tasks     Baseline  5/21- improving     Time  8    Period  Weeks    Status  On-going      PT LONG TERM GOAL #3   Title  Patient to be able to maintain R shoulder stability during dynamic tasks and  challenges in order to assist in return to sports based activities     Baseline  5/21- ongoing     Time  8    Period  Weeks    Status  On-going      PT LONG TERM GOAL #4   Title  Patient to be able to perform all selfcare techniques and dressing/bathing tasks without pain or difficulty in order to show resoluation of shoulder stiffness     Baseline  5/21- met     Time  8     Period  Weeks    Status  Achieved            Plan - 09/21/17 1035    Clinical Impression Statement  Re-assessment. Patient is making excellent progress with skilled PT services, and has met all short term goals as well as some long term goals already; remaining deficits are primarily related to shoulder complex strength and stability as well as shoulder muscle endurance at this time. He will strongly benefit from skilled PT services to continue promoting full return to all dynamic tasks and activities moving forward.     Rehab Potential  Excellent    PT Frequency  2x / week    PT Duration  6 weeks    PT Treatment/Interventions  ADLs/Self Care Home Management;Biofeedback;Cryotherapy;Electrical Stimulation;Iontophoresis 17m/ml Dexamethasone;Moist Heat;Ultrasound;Functional mobility training;Therapeutic activities;Therapeutic exercise;Balance training;Neuromuscular re-education;Patient/family education;Manual techniques;Scar mobilization;Passive range of motion;Dry needling;Taping    PT Next Visit Plan  continue to follow [facility] protocol (10 weeks out of surgery as of 09/23/17)    PT Home Exercise Plan  Eval: scap retractions, prone Is, prone in ER rotation for stretch/to build confidence, supine shoulder ABD/flexion, sidelying ER ROM, blackburn 6     Consulted and Agree with Plan of Care  Patient       Patient will benefit from skilled therapeutic intervention in order to improve the following deficits and impairments:  Increased fascial restricitons, Improper body mechanics, Pain, Increased muscle spasms, Postural dysfunction, Decreased range of motion, Decreased endurance, Decreased strength, Hypomobility, Impaired UE functional use, Impaired flexibility  Visit Diagnosis: Stiffness of right shoulder, not elsewhere classified  Chronic right shoulder pain  Muscle weakness (generalized)  Abnormal posture     Problem List There are no active problems to display for  this patient.   KDeniece ReePT, DPT, [place] Supplemental Physical Therapist [place]  Pager 3[facility]aurel [place] [address]Phone: [phone]  Fax:  [phone] Name: Alex KOEPPENMRN: [id number] of Birth: [birth year]

## 2017-09-21 NOTE — Patient Instructions (Signed)
   Blackburns #7 - Mid Trap/Latissimus Dorsi  Lie down on stomach, rest your forehead on a rolled up towel. Bring your arms by your side with your palms facing the floor. Squeeze your shoulder blades together and lift arms up towards the ceiling.  Repeat 10 times, twice a day (morning and evening)     PRONE W  Lying face down with your elbows bent and palms facing downward, slowly raise your arms up towards the ceiling as you squeeze your shoulder blades downward and towards your spine.  Repeat 10 times, twice a day (morning and evening)     Blackburns #4 - Mid Trap/Rhomboids  Lie down on stomach, rest your forehead on a rolled up towel. Raise your arms out to the side so there is a 90 deg angle between your arm and torso. With elbow straight and palms facing the floor, squeeze your shoulder blades together and lift arms up towards the ceiling.  Repeat 10 times, twice a day (morning and evening)    Blackburns #2 - Lower Trap  Lie down on stomach, rest your forehead on a rolled up towel. Raise your arms overhead, completely straighten elbows and face palms towards the floor. Squeeze your shoulder blades together and lift arms up towards the ceiling.  Repeat 10 times, twice a day (morning and evening)     Blackburns #3 - Mid Trap  Lie down on stomach, rest your forehead on a rolled up towel. Raise your arms out to the side so there is a 90 deg angle between your arm and torso. Elbows should be straight and thumbs point up towards the ceiling. Squeeze your shoulder blades together and lift arms up towards the ceiling.   Repeat 10 times, twice a day (morning and evening)       Blackburns #1 - Lower Trap  While lying on your stomach, raise your arms overhead. Completely straighten elbows and rotate thumbs up towards the ceiling. Squeeze your shoulder blades together and lift your arms up towards the ceiling.   Repeat 10 times, twice a day (morning and evening)

## 2017-09-23 ENCOUNTER — Encounter

## 2017-09-23 ENCOUNTER — Encounter: Payer: Medicaid Other | Admitting: Physical Therapy

## 2017-09-24 ENCOUNTER — Ambulatory Visit: Payer: Medicaid Other | Admitting: Physical Therapy

## 2017-09-29 ENCOUNTER — Ambulatory Visit: Payer: Medicaid Other | Admitting: Physical Therapy

## 2017-09-30 ENCOUNTER — Encounter: Payer: Self-pay | Admitting: Physical Therapy

## 2017-09-30 ENCOUNTER — Ambulatory Visit: Payer: Medicaid Other | Admitting: Physical Therapy

## 2017-09-30 DIAGNOSIS — M25511 Pain in right shoulder: Secondary | ICD-10-CM

## 2017-09-30 DIAGNOSIS — R293 Abnormal posture: Secondary | ICD-10-CM

## 2017-09-30 DIAGNOSIS — M25611 Stiffness of right shoulder, not elsewhere classified: Secondary | ICD-10-CM

## 2017-09-30 DIAGNOSIS — G8929 Other chronic pain: Secondary | ICD-10-CM

## 2017-09-30 DIAGNOSIS — M6281 Muscle weakness (generalized): Secondary | ICD-10-CM

## 2017-09-30 NOTE — Patient Instructions (Signed)
Strengthening: Resisted Internal Rotation  KEEP ELBOW AT SIDE -USE TOWEL ROLL  Hold tubing in left hand, elbow at side and forearm out. Rotate forearm in across body. Repeat _30___ times per set. Do __1__ sets per session. Do _2___ sessions per day.  http://orth.exer.us/830   Copyright  VHI. All rights reserved.  Strengthening: Resisted External Rotation  KEEP ELBOW AT SIDE-USE TOWEL ROLL Hold tubing in right hand, elbow at side and forearm across body. Rotate forearm out. Repeat __30__ times per set. Do ___1_ sets per session. Do _2___ sessions per day.  Wall Push-Up: Double Arm    Stand _3__ feet from wall with both hands on wall. Perform a push-up. Repeat __10_ times per set.  Do _2__ sets per session.

## 2017-09-30 NOTE — Therapy (Signed)
[place], [place], [address] Phone: [phone]   Fax:  [phone]  Physical Therapy Treatment  Patient Details  Name: Alex Quinn MRN: [id number] Date of Birth: [birth year] Referring Provider: Tania Ade    Encounter Date: 09/30/2017  PT End of Session - 09/30/17 0944    Visit Number  4    Number of Visits  15    Date for PT Re-Evaluation  10/26/17    Authorization Type  medicaid (submitted 5/9); reauth submitted 5/21    Authorization Time Period  09/24/2017-11/04/2017    Authorization - Visit Number  1    Authorization - Number of Visits  12    PT Start Time  3825    PT Stop Time  1016    PT Time Calculation (min)  38 min       Past Medical History:  Diagnosis Date  . Dislocated shoulder   . Medical history non-contributory     Past Surgical History:  Procedure Laterality Date  . MANDIBLE FRACTURE SURGERY    . ROTATOR CUFF REPAIR     manibulation  . SHOULDER LATERJET Right 07/15/2017   Procedure: SHOULDER LATERJET;  Surgeon: Tania Ade, MD;  Location: [place];  Service: Orthopedics;  Laterality: Right;    There were no vitals filed for this visit.  Subjective Assessment - 09/30/17 0942    Subjective  Shoulder is doing pretty good. I think I over worked my shoulder with my exercises.     Currently in Pain?  No/denies    Aggravating Factors   shooting basketball    Pain Relieving Factors  heat                        [facility] Adult PT Treatment/Exercise - 09/30/17 0001      Shoulder Exercises: Supine   External Rotation  PROM    Internal Rotation  PROM    Flexion  PROM    ABduction  PROM    Other Supine Exercises  cheset press 3# x30; serratus punches 3# 30    Other Supine Exercises  dynamic stabilizations 5 rounds of 30 R shoulder       Shoulder Exercises: Standing   External Rotation  Right;Theraband 30 reps    Theraband Level (Shoulder External  Rotation)  Level 2 (Red)    Internal Rotation  Right;Theraband    Theraband Level (Shoulder Internal Rotation)  Level 2 (Red) 30 reps    Flexion  20 reps;Weights forward raise    Shoulder Flexion Weight (lbs)  2    ABduction  Right;Weights;20 reps lateral raise    Shoulder ABduction Weight (lbs)  2    Diagonals  AROM;Right    Other Standing Exercises  body blade low flexion and abduction x 30 sec each       Shoulder Exercises: ROM/Strengthening   Wall Pushups  20 reps             PT Education - 09/30/17 1156    Education provided  Yes    Education Details  HEP    Person(s) Educated  Patient    Methods  Explanation;Handout    Comprehension  Verbalized understanding       PT Short Term Goals - 09/21/17 0859      PT SHORT TERM GOAL #1   Title  Patient to be compliant with appropriate HEP, to be updated PRN     Baseline  5/21- compliant  Time  1    Period  Weeks    Status  Achieved      PT SHORT TERM GOAL #2   Title  Patient to demonstrate full R shoulder ROM in order to reduce stiffness and improve functional shoulder mobility     Baseline  5/21- much improved     Time  2    Period  Weeks    Status  Achieved      PT SHORT TERM GOAL #3   Title  Patient to verbalize importance of restoring normal posture, shoulder motion, and shoulder stability in order to return to regular exercise program     Baseline  5/21-  met     Time  2    Period  Weeks    Status  Achieved        PT Long Term Goals - 09/21/17 0900      PT LONG TERM GOAL #1   Title  Patient to demonstrate R shoulder strength as being 5/5 in order to reduce pain and assist in return to dynamic sports activities     Baseline  5/21- ongoing weakness especially rotator cuff muscles     Time  8    Period  Weeks    Status  On-going      PT LONG TERM GOAL #2   Title  Patient to be able to reach overhead and perform ABD with ER without fear in order to show improved confidence in shoulder function and  tasks     Baseline  5/21- improving     Time  8    Period  Weeks    Status  On-going      PT LONG TERM GOAL #3   Title  Patient to be able to maintain R shoulder stability during dynamic tasks and challenges in order to assist in return to sports based activities     Baseline  5/21- ongoing     Time  8    Period  Weeks    Status  On-going      PT LONG TERM GOAL #4   Title  Patient to be able to perform all selfcare techniques and dressing/bathing tasks without pain or difficulty in order to show resoluation of shoulder stiffness     Baseline  5/21- met     Time  8    Period  Weeks    Status  Achieved            Plan - 09/30/17 1154    Clinical Impression Statement  Continued GHJ and scapular strengthening per protocol. Progressed to wall push ups without increased pain. Pt given red band for standing neutral ER and IR strengthening. Pt reports improved confidence with reaching. No pain today with active abduction and ER or combined abduction/ER. Progressing toward LTGs.     PT Next Visit Plan  continue to follow [facility] protocol (11 weeks out of surgery as of 09/30/17)    PT Home Exercise Plan  Eval: scap retractions, prone Is, prone in ER rotation for stretch/to build confidence, supine shoulder ABD/flexion, sidelying ER ROM, blackburn 6 ; red band IR and ER, wall push up    Consulted and Agree with Plan of Care  Patient       Patient will benefit from skilled therapeutic intervention in order to improve the following deficits and impairments:  Increased fascial restricitons, Improper body mechanics, Pain, Increased muscle spasms, Postural dysfunction, Decreased range of motion, Decreased endurance, Decreased  strength, Hypomobility, Impaired UE functional use, Impaired flexibility  Visit Diagnosis: Stiffness of right shoulder, not elsewhere classified  Chronic right shoulder pain  Muscle weakness (generalized)  Abnormal posture     Problem List There are  no active problems to display for this patient.   Dorene Ar, [place] 09/30/2017, 11:58 AM  [facility] [address] [place], [place], [address] Phone: [phone]   Fax:  [phone]  Name: AZIZ SLAPE MRN: [id number] Date of Birth: [birth year]

## 2017-10-05 ENCOUNTER — Ambulatory Visit: Payer: Medicaid Other | Attending: Orthopedic Surgery | Admitting: Physical Therapy

## 2017-10-05 ENCOUNTER — Encounter: Payer: Self-pay | Admitting: Physical Therapy

## 2017-10-05 DIAGNOSIS — M25511 Pain in right shoulder: Secondary | ICD-10-CM | POA: Diagnosis present

## 2017-10-05 DIAGNOSIS — R293 Abnormal posture: Secondary | ICD-10-CM | POA: Insufficient documentation

## 2017-10-05 DIAGNOSIS — G8929 Other chronic pain: Secondary | ICD-10-CM | POA: Diagnosis present

## 2017-10-05 DIAGNOSIS — M25611 Stiffness of right shoulder, not elsewhere classified: Secondary | ICD-10-CM | POA: Insufficient documentation

## 2017-10-05 DIAGNOSIS — M6281 Muscle weakness (generalized): Secondary | ICD-10-CM | POA: Diagnosis present

## 2017-10-05 NOTE — Therapy (Signed)
[place], [place], [address] Phone: [phone]   Fax:  [phone]  Physical Therapy Treatment  Patient Details  Name: Alex Quinn MRN: [id number] Date of Birth: [birth year] Referring Provider: Tania Ade    Encounter Date: 10/05/2017  PT End of Session - 10/05/17 1011    Visit Number  5    Number of Visits  15    Date for PT Re-Evaluation  10/26/17    Authorization Type  medicaid (submitted 5/9); reauth submitted 5/21    Authorization Time Period  09/24/2017-11/04/2017    PT Start Time  0931    PT Stop Time  1010    PT Time Calculation (min)  39 min    Activity Tolerance  Patient tolerated treatment well    Behavior During Therapy  [facility] for tasks assessed/performed       Past Medical History:  Diagnosis Date  . Dislocated shoulder   . Medical history non-contributory     Past Surgical History:  Procedure Laterality Date  . MANDIBLE FRACTURE SURGERY    . ROTATOR CUFF REPAIR     manibulation  . SHOULDER LATERJET Right 07/15/2017   Procedure: SHOULDER LATERJET;  Surgeon: Tania Ade, MD;  Location: [place];  Service: Orthopedics;  Laterality: Right;    There were no vitals filed for this visit.  Subjective Assessment - 10/05/17 0934    Subjective  I'm doing good, I like the band that she gave me last time. My shoulder has been popping but I'm not sure if there's anything I've been doing when it does pop.     Patient Stated Goals  get back to exercise program    Currently in Pain?  No/denies                       [facility] Adult PT Treatment/Exercise - 10/05/17 0001      Shoulder Exercises: Seated   External Rotation  Strengthening;Right;10 reps;Weights    External Rotation Weight (lbs)  3    Flexion  Right;10 reps;Weights    Flexion Weight (lbs)  3    Abduction  Right;10 reps;Weights;Strengthening    ABduction Weight (lbs)  3    Other Seated Exercises  overhead  press 1x10 3#       Shoulder Exercises: Prone   Other Prone Exercises  prone rows 3# 1x15 R UE; low planks 3x-15 seconds     Other Prone Exercises  blackburn 6 with 1# B, 1x10       Shoulder Exercises: Standing   Other Standing Exercises  arms on fire x60 seconds CW and CCW; body blade 30 seconds x3 horizontal and vertical     Other Standing Exercises  pushup plus on counter top 1x10; diamond pushups 1x10              PT Education - 10/05/17 1011    Education provided  Yes    Education Details  exercise form during session     Person(s) Educated  Patient    Methods  Explanation    Comprehension  Verbalized understanding       PT Short Term Goals - 09/21/17 0859      PT SHORT TERM GOAL #1   Title  Patient to be compliant with appropriate HEP, to be updated PRN     Baseline  5/21- compliant     Time  1    Period  Weeks    Status  Achieved      PT SHORT TERM GOAL #2   Title  Patient to demonstrate full R shoulder ROM in order to reduce stiffness and improve functional shoulder mobility     Baseline  5/21- much improved     Time  2    Period  Weeks    Status  Achieved      PT SHORT TERM GOAL #3   Title  Patient to verbalize importance of restoring normal posture, shoulder motion, and shoulder stability in order to return to regular exercise program     Baseline  5/21-  met     Time  2    Period  Weeks    Status  Achieved        PT Long Term Goals - 09/21/17 0900      PT LONG TERM GOAL #1   Title  Patient to demonstrate R shoulder strength as being 5/5 in order to reduce pain and assist in return to dynamic sports activities     Baseline  5/21- ongoing weakness especially rotator cuff muscles     Time  8    Period  Weeks    Status  On-going      PT LONG TERM GOAL #2   Title  Patient to be able to reach overhead and perform ABD with ER without fear in order to show improved confidence in shoulder function and tasks     Baseline  5/21- improving     Time  8     Period  Weeks    Status  On-going      PT LONG TERM GOAL #3   Title  Patient to be able to maintain R shoulder stability during dynamic tasks and challenges in order to assist in return to sports based activities     Baseline  5/21- ongoing     Time  8    Period  Weeks    Status  On-going      PT LONG TERM GOAL #4   Title  Patient to be able to perform all selfcare techniques and dressing/bathing tasks without pain or difficulty in order to show resoluation of shoulder stiffness     Baseline  5/21- met     Time  8    Period  Weeks    Status  Achieved            Plan - 10/05/17 1011    Clinical Impression Statement  Continued to progress strength as allowed per protocol and tolerated today, patient continues to do well and remains highly motivated moving forward. He continues to progress as expected and has good awareness regarding activity precautions and restrictions at this time.     Rehab Potential  Excellent    PT Frequency  2x / week    PT Duration  6 weeks    PT Treatment/Interventions  ADLs/Self Care Home Management;Biofeedback;Cryotherapy;Electrical Stimulation;Iontophoresis 43m/ml Dexamethasone;Moist Heat;Ultrasound;Functional mobility training;Therapeutic activities;Therapeutic exercise;Balance training;Neuromuscular re-education;Patient/family education;Manual techniques;Scar mobilization;Passive range of motion;Dry needling;Taping    PT Next Visit Plan  continue to follow [facility] protocol (11 weeks out of surgery as of 09/30/17)    PT Home Exercise Plan  Eval: scap retractions, prone Is, prone in ER rotation for stretch/to build confidence, supine shoulder ABD/flexion, sidelying ER ROM, blackburn 6 ; red band IR and ER, wall push up    Consulted and Agree with Plan of Care  Patient       Patient will benefit from skilled  therapeutic intervention in order to improve the following deficits and impairments:  Increased fascial restricitons, Improper body  mechanics, Pain, Increased muscle spasms, Postural dysfunction, Decreased range of motion, Decreased endurance, Decreased strength, Hypomobility, Impaired UE functional use, Impaired flexibility  Visit Diagnosis: Stiffness of right shoulder, not elsewhere classified  Chronic right shoulder pain  Muscle weakness (generalized)  Abnormal posture     Problem List There are no active problems to display for this patient.   Deniece Ree PT, DPT, [place]  Supplemental Physical Therapist [place]   Pager [place] [facility] [address] [place], [place], [address] Phone: [phone]   Fax:  [phone]  Name: PLEASANT BENSINGER MRN: [id number] Date of Birth: 07/01/1980

## 2017-10-08 ENCOUNTER — Ambulatory Visit: Payer: Medicaid Other | Admitting: Physical Therapy

## 2017-10-12 ENCOUNTER — Encounter: Payer: Self-pay | Admitting: Physical Therapy

## 2017-10-12 ENCOUNTER — Ambulatory Visit: Payer: Medicaid Other | Admitting: Physical Therapy

## 2017-10-12 DIAGNOSIS — G8929 Other chronic pain: Secondary | ICD-10-CM

## 2017-10-12 DIAGNOSIS — M6281 Muscle weakness (generalized): Secondary | ICD-10-CM

## 2017-10-12 DIAGNOSIS — M25611 Stiffness of right shoulder, not elsewhere classified: Secondary | ICD-10-CM | POA: Diagnosis not present

## 2017-10-12 DIAGNOSIS — M25511 Pain in right shoulder: Secondary | ICD-10-CM

## 2017-10-12 DIAGNOSIS — R293 Abnormal posture: Secondary | ICD-10-CM

## 2017-10-12 NOTE — Therapy (Signed)
[place], [place], [address] Phone: [phone]   Fax:  [phone]  Physical Therapy Treatment  Patient Details  Name: Alex Quinn MRN: [id number] Date of Birth: 01-12-1981 Referring Provider: Tania Ade    Encounter Date: 10/12/2017  PT End of Session - 10/12/17 0940    Visit Number  6    Number of Visits  15    Date for PT Re-Evaluation  10/26/17    Authorization Type  medicaid (submitted 5/9); reauth submitted 5/21    Authorization Time Period  09/24/2017-11/04/2017    Authorization - Visit Number  2    Authorization - Number of Visits  12    PT Start Time  0936    PT Stop Time  5374    PT Time Calculation (min)  38 min       Past Medical History:  Diagnosis Date  . Dislocated shoulder   . Medical history non-contributory     Past Surgical History:  Procedure Laterality Date  . MANDIBLE FRACTURE SURGERY    . ROTATOR CUFF REPAIR     manibulation  . SHOULDER LATERJET Right 07/15/2017   Procedure: SHOULDER LATERJET;  Surgeon: Tania Ade, MD;  Location: [place];  Service: Orthopedics;  Laterality: Right;    There were no vitals filed for this visit.  Subjective Assessment - 10/12/17 0938    Subjective  No pain at rest. I think I may be over working the shoulder.     Currently in Pain?  No/denies                       [facility] Adult PT Treatment/Exercise - 10/12/17 0001      Shoulder Exercises: Prone   Other Prone Exercises  prone rows 3# 1x15 R UE; low planks 3x-15 seconds     Other Prone Exercises  blackburn 6 with 1# B, 1x10       Shoulder Exercises: Sidelying   External Rotation  20 reps    External Rotation Weight (lbs)  2      Shoulder Exercises: Standing   Flexion  20 reps;Weights forward raise    Shoulder Flexion Weight (lbs)  3    ABduction  Right;Weights;20 reps lateral raise    Shoulder ABduction Weight (lbs)  3    Other Standing Exercises   body blade 30 seconds x3 horizontal and vertical , overhead press 1 x 10 3#     Other Standing Exercises  pushup plus on counter top 1x10; diamond pushups 1x10       Shoulder Exercises: Pulleys   Flexion  2 minutes      Shoulder Exercises: ROM/Strengthening   UBE (Upper Arm Bike)  L2 Forward x 2 minutes, retro x 2 minutes                PT Short Term Goals - 09/21/17 0859      PT SHORT TERM GOAL #1   Title  Patient to be compliant with appropriate HEP, to be updated PRN     Baseline  5/21- compliant     Time  1    Period  Weeks    Status  Achieved      PT SHORT TERM GOAL #2   Title  Patient to demonstrate full R shoulder ROM in order to reduce stiffness and improve functional shoulder mobility     Baseline  5/21- much improved     Time  2  Period  Weeks    Status  Achieved      PT SHORT TERM GOAL #3   Title  Patient to verbalize importance of restoring normal posture, shoulder motion, and shoulder stability in order to return to regular exercise program     Baseline  5/21-  met     Time  2    Period  Weeks    Status  Achieved        PT Long Term Goals - 09/21/17 0900      PT LONG TERM GOAL #1   Title  Patient to demonstrate R shoulder strength as being 5/5 in order to reduce pain and assist in return to dynamic sports activities     Baseline  5/21- ongoing weakness especially rotator cuff muscles     Time  8    Period  Weeks    Status  On-going      PT LONG TERM GOAL #2   Title  Patient to be able to reach overhead and perform ABD with ER without fear in order to show improved confidence in shoulder function and tasks     Baseline  5/21- improving     Time  8    Period  Weeks    Status  On-going      PT LONG TERM GOAL #3   Title  Patient to be able to maintain R shoulder stability during dynamic tasks and challenges in order to assist in return to sports based activities     Baseline  5/21- ongoing     Time  8    Period  Weeks    Status  On-going       PT LONG TERM GOAL #4   Title  Patient to be able to perform all selfcare techniques and dressing/bathing tasks without pain or difficulty in order to show resoluation of shoulder stiffness     Baseline  5/21- met     Time  8    Period  Weeks    Status  Achieved            Plan - 10/12/17 1014    Clinical Impression Statement  Continued with shoulder strengthening per protocol. Able to increase ROM with counter push ups. He reports shooting basketball at home. Fatigue at end of session. No increased pain.     PT Next Visit Plan  continue to follow [facility] protocol (12 weeks out of surgery as of 10/07/17)    PT Home Exercise Plan  Eval: scap retractions, prone Is, prone in ER rotation for stretch/to build confidence, supine shoulder ABD/flexion, sidelying ER ROM, blackburn 6 ; red band IR and ER, wall push up    Consulted and Agree with Plan of Care  Patient       Patient will benefit from skilled therapeutic intervention in order to improve the following deficits and impairments:  Increased fascial restricitons, Improper body mechanics, Pain, Increased muscle spasms, Postural dysfunction, Decreased range of motion, Decreased endurance, Decreased strength, Hypomobility, Impaired UE functional use, Impaired flexibility  Visit Diagnosis: Stiffness of right shoulder, not elsewhere classified  Chronic right shoulder pain  Muscle weakness (generalized)  Abnormal posture     Problem List There are no active problems to display for this patient.   Dorene Ar, [place] 10/12/2017, 10:17 AM  [facility] [address] [place], [place], [address] Phone: [phone]   Fax:  [phone]  Name: Alex Quinn MRN: [id number]  Date of Birth: 02/25/1981

## 2017-10-14 ENCOUNTER — Telehealth: Payer: Self-pay | Admitting: Physical Therapy

## 2017-10-14 ENCOUNTER — Ambulatory Visit: Payer: Medicaid Other | Admitting: Physical Therapy

## 2017-10-14 NOTE — Telephone Encounter (Signed)
No-show. Called and spoke to patient, he apologizes as they had a death in the family and he is in [place]. He saw Dr. Ave Filterhandler and states that the MD is very happy with his progress. He plans to be at his next appointment on Tuesday.  Nedra HaiKristen Veleka Djordjevic PT, DPT, CBIS  Supplemental Physical Therapist [facility]   Pager [phone]

## 2017-10-19 ENCOUNTER — Ambulatory Visit: Payer: Medicaid Other | Admitting: Physical Therapy

## 2017-10-19 ENCOUNTER — Encounter: Payer: Self-pay | Admitting: Physical Therapy

## 2017-10-19 DIAGNOSIS — M25611 Stiffness of right shoulder, not elsewhere classified: Secondary | ICD-10-CM

## 2017-10-19 DIAGNOSIS — M25511 Pain in right shoulder: Secondary | ICD-10-CM

## 2017-10-19 DIAGNOSIS — R293 Abnormal posture: Secondary | ICD-10-CM

## 2017-10-19 DIAGNOSIS — M6281 Muscle weakness (generalized): Secondary | ICD-10-CM

## 2017-10-19 DIAGNOSIS — G8929 Other chronic pain: Secondary | ICD-10-CM

## 2017-10-19 NOTE — Patient Instructions (Addendum)
PNF Strengthening: Resisted   Place band in low door or in other hand. Pull out and up so the band stretches across your body.  Repeat 10-20 reps, 1-2 sets, 2 x per day

## 2017-10-19 NOTE — Therapy (Signed)
[place], [place], [address] Phone: [phone]   Fax:  [phone]  Physical Therapy Treatment  Patient Details  Name: Alex Quinn MRN: [id number] Date of Birth: 02/17/1981 Referring Provider: Tania Ade    Encounter Date: 10/19/2017  PT End of Session - 10/19/17 0937    Visit Number  7    Number of Visits  15    Date for PT Re-Evaluation  10/26/17    Authorization Type  medicaid (submitted 5/9); reauth submitted 5/21    Authorization Time Period  09/24/2017-11/04/2017    Authorization - Visit Number  4    Authorization - Number of Visits  12    PT Start Time  0932    PT Stop Time  1011    PT Time Calculation (min)  39 min       Past Medical History:  Diagnosis Date  . Dislocated shoulder   . Medical history non-contributory     Past Surgical History:  Procedure Laterality Date  . MANDIBLE FRACTURE SURGERY    . ROTATOR CUFF REPAIR     manibulation  . SHOULDER LATERJET Right 07/15/2017   Procedure: SHOULDER LATERJET;  Surgeon: Tania Ade, MD;  Location: [place];  Service: Orthopedics;  Laterality: Right;    There were no vitals filed for this visit.  Subjective Assessment - 10/19/17 0935    Subjective  No pain. Doing alot of counter push ups, constantly doing something, Was able to lift a pitcher full of koolaid without my arm shaking,     Currently in Pain?  No/denies                       [facility] Adult PT Treatment/Exercise - 10/19/17 0001      Shoulder Exercises: Supine   Protraction  15 reps;Weights    Protraction Weight (lbs)  3    External Rotation  PROM    Internal Rotation  PROM    Flexion  PROM    ABduction  PROM    Other Supine Exercises  dynamic stabilizations 5 rounds of 30 R shoulder in flexion, and Er       Shoulder Exercises: Standing   External Rotation  Right;Theraband 30 reps    Theraband Level (Shoulder External Rotation)  Level 3  (Green)    Internal Rotation  Right;Theraband    Theraband Level (Shoulder Internal Rotation)  Level 3 (Green)    Flexion  20 reps;Weights forward raise    Shoulder Flexion Weight (lbs)  3    ABduction  Right;Weights;20 reps lateral raise    Shoulder ABduction Weight (lbs)  3    Diagonals  Theraband;20 reps    Theraband Level (Shoulder Diagonals)  Level 2 (Red)    Other Standing Exercises  standing forward punch green band , ball on wall circles x 60 sec .    Other Standing Exercises  Puish up on mat from knees x 15      Shoulder Exercises: ROM/Strengthening   UBE (Upper Arm Bike)  L2 Forward x 2 minutes, retro x 2 minutes     Other ROM/Strengthening Exercises  Bicep curls 5# x 20 thumb up , thumb out              PT Education - 10/19/17 1017    Education provided  Yes    Education Details  HEP     Person(s) Educated  Patient    Methods  Explanation;Handout  Comprehension  Verbalized understanding       PT Short Term Goals - 09/21/17 0859      PT SHORT TERM GOAL #1   Title  Patient to be compliant with appropriate HEP, to be updated PRN     Baseline  5/21- compliant     Time  1    Period  Weeks    Status  Achieved      PT SHORT TERM GOAL #2   Title  Patient to demonstrate full R shoulder ROM in order to reduce stiffness and improve functional shoulder mobility     Baseline  5/21- much improved     Time  2    Period  Weeks    Status  Achieved      PT SHORT TERM GOAL #3   Title  Patient to verbalize importance of restoring normal posture, shoulder motion, and shoulder stability in order to return to regular exercise program     Baseline  5/21-  met     Time  2    Period  Weeks    Status  Achieved        PT Long Term Goals - 09/21/17 0900      PT LONG TERM GOAL #1   Title  Patient to demonstrate R shoulder strength as being 5/5 in order to reduce pain and assist in return to dynamic sports activities     Baseline  5/21- ongoing weakness especially rotator  cuff muscles     Time  8    Period  Weeks    Status  On-going      PT LONG TERM GOAL #2   Title  Patient to be able to reach overhead and perform ABD with ER without fear in order to show improved confidence in shoulder function and tasks     Baseline  5/21- improving     Time  8    Period  Weeks    Status  On-going      PT LONG TERM GOAL #3   Title  Patient to be able to maintain R shoulder stability during dynamic tasks and challenges in order to assist in return to sports based activities     Baseline  5/21- ongoing     Time  8    Period  Weeks    Status  On-going      PT LONG TERM GOAL #4   Title  Patient to be able to perform all selfcare techniques and dressing/bathing tasks without pain or difficulty in order to show resoluation of shoulder stiffness     Baseline  5/21- met     Time  8    Period  Weeks    Status  Achieved            Plan - 10/19/17 0954    Clinical Impression Statement  Pt reports he has been working on counter push ups and all HEP consistently. He reports no shoulder pain. Able to progess to knee push ups from mat/floor without pain. Progressed to resisted bicep curls and standing red band diagonals without increased pain. All added to HEP.     PT Next Visit Plan  continue to follow [facility] protocol (13 weeks out of surgery as of 10/14/17)- review newest HEP    PT Home Exercise Plan  Eval: scap retractions, prone Is, prone in ER rotation for stretch/to build confidence, supine shoulder ABD/flexion, sidelying ER ROM, blackburn 6 ; green band IR and  ER, wall push up-progressed to counter, the to floor from knees, standing diagonals red band, bicep curls 5#    Consulted and Agree with Plan of Care  Patient       Patient will benefit from skilled therapeutic intervention in order to improve the following deficits and impairments:  Increased fascial restricitons, Improper body mechanics, Pain, Increased muscle spasms, Postural dysfunction,  Decreased range of motion, Decreased endurance, Decreased strength, Hypomobility, Impaired UE functional use, Impaired flexibility  Visit Diagnosis: Stiffness of right shoulder, not elsewhere classified  Chronic right shoulder pain  Muscle weakness (generalized)  Abnormal posture     Problem List There are no active problems to display for this patient.   Dorene Ar, [place] 10/19/2017, 10:35 AM  [facility] [address] [place], [place], [address] Phone: [phone]   Fax:  [phone]  Name: Alex Quinn MRN: [id number] Date of Birth: October 16, 1980

## 2017-10-21 ENCOUNTER — Ambulatory Visit: Payer: Medicaid Other | Admitting: Physical Therapy

## 2017-10-26 ENCOUNTER — Ambulatory Visit: Payer: Medicaid Other | Admitting: Physical Therapy

## 2017-10-28 ENCOUNTER — Encounter: Payer: Self-pay | Admitting: Physical Therapy

## 2017-10-28 ENCOUNTER — Ambulatory Visit: Payer: Medicaid Other | Admitting: Physical Therapy

## 2017-10-28 DIAGNOSIS — G8929 Other chronic pain: Secondary | ICD-10-CM

## 2017-10-28 DIAGNOSIS — M25611 Stiffness of right shoulder, not elsewhere classified: Secondary | ICD-10-CM | POA: Diagnosis not present

## 2017-10-28 DIAGNOSIS — M6281 Muscle weakness (generalized): Secondary | ICD-10-CM

## 2017-10-28 DIAGNOSIS — M25511 Pain in right shoulder: Secondary | ICD-10-CM

## 2017-10-28 DIAGNOSIS — R293 Abnormal posture: Secondary | ICD-10-CM

## 2017-10-28 NOTE — Patient Instructions (Addendum)
   PLANK  While lying face down, lift your body up on your elbows and toes. Try and maintain a straight spine. Do not allow your hips or pelvis on either side to drop. Maintain pelvic neutral position the entire time.  Start by holding 20 seconds, 3-5 repetitions, 1-2 times per day.    LATERAL PLANK  While lying on your side, lift your body up on your elbow and feet. Try and maintain a straight spine.  Do this exercise on your right side only to challenge your right shoulder.   Hold for 15 seconds and increase time as this gets easier.

## 2017-10-28 NOTE — Therapy (Signed)
[place], [place], [address] Phone: [phone]   Fax:  [phone]  Physical Therapy Treatment (ERO/DC)  Patient Details  Name: Alex Quinn MRN: [id number] Date of Birth: 09-Nov-1980 Referring Provider: Tania Ade    Encounter Date: 10/28/2017   PHYSICAL THERAPY DISCHARGE SUMMARY  Visits from Start of Care: 8  Current functional level related to goals / functional outcomes: Patient has made excellent progress with skilled PT services and has met all goals. DC today due to patient satisfaction with current level of function and all goals met.    Remaining deficits: See below    Education / Equipment: See below  Plan: Patient agrees to discharge.  Patient goals were met. Patient is being discharged due to being pleased with the current functional level.  ?????       PT End of Session - 10/28/17 1009    Visit Number  8    Number of Visits  8    Date for PT Re-Evaluation  10/28/17    Authorization Type  medicaid (submitted 5/9); reauth submitted 5/21    Authorization Time Period  09/24/2017-11/04/2017    Authorization - Visit Number  5    Authorization - Number of Visits  12    PT Start Time  1219    PT Stop Time  1006 DC, no further skilled PT services required     PT Time Calculation (min)  35 min    Activity Tolerance  Patient tolerated treatment well    Behavior During Therapy  [facility] for tasks assessed/performed       Past Medical History:  Diagnosis Date  . Dislocated shoulder   . Medical history non-contributory     Past Surgical History:  Procedure Laterality Date  . MANDIBLE FRACTURE SURGERY    . ROTATOR CUFF REPAIR     manibulation  . SHOULDER LATERJET Right 07/15/2017   Procedure: SHOULDER LATERJET;  Surgeon: Tania Ade, MD;  Location: [place];  Service: Orthopedics;  Laterality: Right;    There were no vitals filed for this visit.  Subjective Assessment -  10/28/17 0934    Subjective  My shoulder feels very good, I was able to do a full floor to chest pushup the other day and my shoulder felt great. I've not had any pain, it hasn't even popped. I still have some defensive mechanicsm but I can do everything I need and want right now.     Patient Stated Goals  get back to exercise program    Currently in Pain?  No/denies         [facility] PT Assessment - 10/28/17 0001      Observation/Other Assessments   Observations  R shoulder extension 5/5; mid and lower traps/rhomboids 3+/5       AROM   Right Shoulder Flexion  -- full ROM     Right Shoulder ABduction  -- full ROM     Right Shoulder Internal Rotation  -- full ROM     Right Shoulder External Rotation  -- full ROM       Strength   Right Shoulder Flexion  5/5    Right Shoulder ABduction  5/5    Right Shoulder Internal Rotation  5/5    Right Shoulder External Rotation  5/5                   [facility] Adult PT Treatment/Exercise - 10/28/17 0001      Shoulder Exercises:  Prone   Other Prone Exercises  blackburn 6 with 1# 1x15; pushpus with ball under R hand 1x5     Other Prone Exercises  standard plank 3x20 seconds; side planks 3x20 seconds each       Shoulder Exercises: Standing   Other Standing Exercises  clean and press 10# ketllebell x10     Other Standing Exercises  arms on fire 60 seconds each way              PT Education - 10/28/17 1008    Education provided  Yes    Education Details  progress towards goals, all goals met, defer to Dr. Tamera Punt regarding return to weight lifting/gym and precautions thereof, HEP updates, DC today     Person(s) Educated  Patient    Methods  Explanation;Demonstration;Handout    Comprehension  Verbalized understanding;Returned demonstration       PT Short Term Goals - 10/28/17 0941      PT SHORT TERM GOAL #1   Title  Patient to be compliant with appropriate HEP, to be updated PRN     Baseline  6/27- compliant     Time  1     Period  Weeks    Status  Achieved      PT SHORT TERM GOAL #2   Title  Patient to demonstrate full R shoulder ROM in order to reduce stiffness and improve functional shoulder mobility     Baseline  6/27- full ROM     Time  2    Period  Weeks    Status  Achieved      PT SHORT TERM GOAL #3   Title  Patient to verbalize importance of restoring normal posture, shoulder motion, and shoulder stability in order to return to regular exercise program     Baseline  6/27- met     Time  2    Period  Weeks    Status  Achieved        PT Long Term Goals - 10/28/17 0942      PT LONG TERM GOAL #1   Title  Patient to demonstrate R shoulder strength as being 5/5 in order to reduce pain and assist in return to dynamic sports activities     Baseline  6/27- scapular weakness, shoulder 5/5 (flowsheets)    Time  8    Status  Achieved      PT LONG TERM GOAL #2   Title  Patient to be able to reach overhead and perform ABD with ER without fear in order to show improved confidence in shoulder function and tasks     Baseline  6/27- no fear or hesitation     Time  8    Period  Weeks    Status  Achieved      PT LONG TERM GOAL #3   Title  Patient to be able to maintain R shoulder stability during dynamic tasks and challenges in order to assist in return to sports based activities     Baseline  6/27- met     Time  8    Status  Achieved      PT LONG TERM GOAL #4   Title  Patient to be able to perform all selfcare techniques and dressing/bathing tasks without pain or difficulty in order to show resoluation of shoulder stiffness     Baseline  6/27- met     Time  8    Period  Weeks    Status  Achieved            Plan - 10/28/17 1009    Clinical Impression Statement  Re-assessment performed today. Patient has made excellent progress with skilled PT services and reports no functional deficits and that he is able to do everything he needs and wants at this point; fear-avoidance behavior in regards to  right shoulder has appeared to resolve and patient is aware of general precautions for his shoulder at this point. Recommend DC from skilled PT services today due to all goals being met and patient very satisfied with current level of function.     Rehab Potential  Excellent    PT Next Visit Plan  DC today     PT Home Exercise Plan  Eval: scap retractions, prone Is, prone in ER rotation for stretch/to build confidence, supine shoulder ABD/flexion, sidelying ER ROM, blackburn 6 ; green band IR and ER, wall push up-progressed to counter, the to floor from knees, standing diagonals red band, bicep curls 5#; standard and lateral planks, arms on fire, weighted blackburn 6 (1-2#)    Consulted and Agree with Plan of Care  Patient       Patient will benefit from skilled therapeutic intervention in order to improve the following deficits and impairments:  Increased fascial restricitons, Improper body mechanics, Pain, Increased muscle spasms, Postural dysfunction, Decreased range of motion, Decreased endurance, Decreased strength, Hypomobility, Impaired UE functional use, Impaired flexibility  Visit Diagnosis: Stiffness of right shoulder, not elsewhere classified  Chronic right shoulder pain  Muscle weakness (generalized)  Abnormal posture     Problem List There are no active problems to display for this patient.   Deniece Ree PT, DPT, [place]  Supplemental Physical Therapist [place]   Pager [facility] [address] [place], [place], [address] Phone: [phone]   Fax:  [phone]  Name: Alex Quinn MRN: [id number] Date of Birth: 02/08/1981

## 2017-11-01 ENCOUNTER — Ambulatory Visit: Payer: Medicaid Other | Admitting: Physical Therapy

## 2018-01-21 ENCOUNTER — Other Ambulatory Visit: Payer: Self-pay

## 2018-01-21 ENCOUNTER — Emergency Department (HOSPITAL_COMMUNITY)
Admission: EM | Admit: 2018-01-21 | Discharge: 2018-01-21 | Disposition: A | Discharge status: Home / Self Care | Payer: Medicaid Other | Attending: Emergency Medicine | Admitting: Emergency Medicine

## 2018-01-21 ENCOUNTER — Encounter (HOSPITAL_COMMUNITY): Payer: Self-pay | Admitting: Emergency Medicine

## 2018-01-21 DIAGNOSIS — Z113 Encounter for screening for infections with a predominantly sexual mode of transmission: Secondary | ICD-10-CM | POA: Insufficient documentation

## 2018-01-21 DIAGNOSIS — Z87891 Personal history of nicotine dependence: Secondary | ICD-10-CM | POA: Insufficient documentation

## 2018-01-21 NOTE — ED Provider Notes (Signed)
[facility] EMERGENCY DEPARTMENT Provider Note   CSN: [id number] Arrival date & time: 01/21/18  1412     History   Chief Complaint Chief Complaint  Patient presents with  . SEXUALLY TRANSMITTED DISEASE    HPI Alex Quinn is a 37 y.o. male senting for STD check.  Patient states he is here for an STD check.  He denies symptoms.  He is sexually active with one male partner, who is also symptom-free.  He has no medical problems, takes no medications daily.  Has fevers, chills, nausea, vomiting, abdominal pain.  Denies urinary symptoms.  HPI  Past Medical History:  Diagnosis Date  . Dislocated shoulder   . Medical history non-contributory     There are no active problems to display for this patient.   Past Surgical History:  Procedure Laterality Date  . MANDIBLE FRACTURE SURGERY    . ROTATOR CUFF REPAIR     manibulation  . SHOULDER LATERJET Right 07/15/2017   Procedure: SHOULDER LATERJET;  Surgeon: Jones Broom, MD;  Location: [facility] OR;  Service: Orthopedics;  Laterality: Right;        Home Medications    Prior to Admission medications   Not on File    Family History No family history on file.  Social History Social History   Tobacco Use  . Smoking status: Former Smoker    Types: Cigarettes    Last attempt to quit: 06/30/2016    Years since quitting: 1.5  . Smokeless tobacco: Never Used  Substance Use Topics  . Alcohol use: No  . Drug use: No     Allergies   Patient has no known allergies.   Review of Systems Review of Systems  Gastrointestinal: Negative for nausea and vomiting.  Genitourinary: Negative for dysuria, frequency and hematuria.     Physical Exam Updated Vital Signs BP (!) 145/92 (BP Location: Right Arm)   Pulse 83   Temp 99.8 F (37.7 C) (Oral)   Resp 16   Ht 6' (1.829 m)   Wt 97.5 kg   SpO2 100%   BMI 29.16 kg/m   Physical Exam  Constitutional: He is oriented to person, place, and time. He  appears well-developed and well-nourished. No distress.  HENT:  Head: Normocephalic and atraumatic.  Eyes: EOM are normal.  Neck: Normal range of motion.  Cardiovascular: Normal rate, regular rhythm and intact distal pulses.  Pulmonary/Chest: Effort normal and breath sounds normal. No respiratory distress. He has no wheezes.  Abdominal: He exhibits no distension.  Genitourinary: Testes normal and penis normal. Circumcised.  Genitourinary Comments: Chaperone present.  No penile discharge.  No penile tenderness.  No inguinal lymphadenopathy.  Musculoskeletal: Normal range of motion.  Lymphadenopathy: No inguinal adenopathy noted on the right or left side.  Neurological: He is alert and oriented to person, place, and time.  Skin: Skin is warm. No rash noted.  Psychiatric: He has a normal mood and affect.  Nursing note and vitals reviewed.    ED Treatments / Results  Labs (all labs ordered are listed, but only abnormal results are displayed) Labs Reviewed  RPR  HIV ANTIBODY (ROUTINE TESTING W REFLEX)  GC/CHLAMYDIA PROBE AMP ([place]) NOT AT [facility]    EKG None  Radiology No results found.  Procedures Procedures (including critical care time)  Medications Ordered in ED Medications - No data to display   Initial Impression / Assessment and Plan / ED Course  I have reviewed the triage vital signs and the  nursing notes.  Pertinent labs & imaging results that were available during my care of the patient were reviewed by me and considered in my medical decision making (see chart for details).     Pt presenting for request for STD testing.  Physical exam reassuring, no discharge or adenopathy.  Gonorrhea chlamydia sent.  HIV and RPR sent.  Patient informed labs are pending, if there positive he will see her phone call, and can follow-up at the health department for treatment.  At this time, patient appears safe for discharge.  Return precautions given.  Patient states he  understands and agrees to plan.   Final Clinical Impressions(s) / ED Diagnoses   Final diagnoses:  Encounter for screening examination for sexually transmitted disease    ED Discharge Orders    None       Alveria ApleyCaccavale, Teryl Gubler, PA-C 01/21/18 1648    Tilden Fossaees, Elizabeth, MD 01/22/18 0945

## 2018-01-21 NOTE — Discharge Instructions (Addendum)
You will receive a phone call if results are positive.  You were tested for gonorrhea, media, syphilis, and HIV. If results are positive, you will need to follow-up with the health department. If you would like standard testing, you should follow-up with the health department. Return to the emergency room if you develop any new, worsening, or concerning symptoms.

## 2018-01-21 NOTE — ED Provider Notes (Signed)
Patient placed in Quick Look pathway, seen and evaluated   Chief Complaint: STD check  HPI:  Alex Quinn is a 37 y.o. male who presents to the ED for STD check.   ROS: patient reports no symptoms just wants checked  Physical Exam:    Gen: No distress  Neuro: Awake and Alert  Skin: Warm and dry   Initiation of care has begun. The patient has been counseled on the process, plan, and necessity for staying for the completion/evaluation, and the remainder of the medical screening examination    Janne Napoleoneese, Hope M, NP 01/21/18 1523    Pricilla LovelessGoldston, Scott, MD 01/22/18 [phone]

## 2018-01-21 NOTE — ED Triage Notes (Signed)
Pt st's he needs STD check

## 2018-01-22 ENCOUNTER — Other Ambulatory Visit: Payer: Self-pay

## 2018-01-22 ENCOUNTER — Emergency Department (HOSPITAL_COMMUNITY)
Admission: EM | Admit: 2018-01-22 | Discharge: 2018-01-22 | Disposition: A | Discharge status: Home / Self Care | Payer: Medicaid Other | Attending: Emergency Medicine | Admitting: Emergency Medicine

## 2018-01-22 ENCOUNTER — Encounter (HOSPITAL_COMMUNITY): Payer: Self-pay | Admitting: Emergency Medicine

## 2018-01-22 DIAGNOSIS — R369 Urethral discharge, unspecified: Secondary | ICD-10-CM | POA: Insufficient documentation

## 2018-01-22 DIAGNOSIS — Z711 Person with feared health complaint in whom no diagnosis is made: Secondary | ICD-10-CM | POA: Insufficient documentation

## 2018-01-22 DIAGNOSIS — Z87891 Personal history of nicotine dependence: Secondary | ICD-10-CM | POA: Insufficient documentation

## 2018-01-22 LAB — RPR: RPR Ser Ql: NONREACTIVE

## 2018-01-22 LAB — HIV ANTIBODY (ROUTINE TESTING W REFLEX): HIV Screen 4th Generation wRfx: NONREACTIVE

## 2018-01-22 MED ORDER — CEFTRIAXONE SODIUM 250 MG IJ SOLR
250.0000 mg | Freq: Once | INTRAMUSCULAR | Status: AC
  Administered 2018-01-22: 250 mg via INTRAMUSCULAR
  Filled 2018-01-22: qty 250

## 2018-01-22 MED ORDER — STERILE WATER FOR INJECTION IJ SOLN
INTRAMUSCULAR | Status: AC
  Administered 2018-01-22: 0.9 mL
  Filled 2018-01-22: qty 10

## 2018-01-22 MED ORDER — AZITHROMYCIN 1 G PO PACK
1.0000 g | PACK | Freq: Once | ORAL | Status: AC
  Administered 2018-01-22: 1 g via ORAL
  Filled 2018-01-22: qty 1

## 2018-01-22 NOTE — ED Provider Notes (Signed)
[facility] Provider Note   CSN: [id number] Arrival date & time: 01/22/18  1215     History   Chief Complaint Chief Complaint  Patient presents with  . SEXUALLY TRANSMITTED DISEASE  . Penile Discharge    HPI Alex Quinn is a 37 y.o. male.  37 year old male presents with complaint of urethral discharge.  Patient describes discharge as clear to gray-colored.  Patient was here yesterday for STD testing, documentation states no symptoms at that time however patient states that he had symptoms yesterday and wanted to be treated but only testing was done and now he would like to be treated.  No other complaints or concerns.     Past Medical History:  Diagnosis Date  . Dislocated shoulder   . Medical history non-contributory     There are no active problems to display for this patient.   Past Surgical History:  Procedure Laterality Date  . MANDIBLE FRACTURE SURGERY    . ROTATOR CUFF REPAIR     manibulation  . SHOULDER LATERJET Right 07/15/2017   Procedure: SHOULDER LATERJET;  Surgeon: Jones Broomhandler, Justin, MD;  [facility];  Service: Orthopedics;  Laterality: Right;        Home Medications    Prior to Admission medications   Not on File    Family History No family history on file.  Social History Social History   Tobacco Use  . Smoking status: Former Smoker    Types: Cigarettes    Last attempt to quit: 06/30/2016    Years since quitting: 1.5  . Smokeless tobacco: Never Used  Substance Use Topics  . Alcohol use: No  . Drug use: No     Allergies   Patient has no known allergies.   Review of Systems Review of Systems  Constitutional: Negative for fever.  Gastrointestinal: Negative for abdominal pain.  Genitourinary: Positive for discharge. Negative for difficulty urinating, dysuria, penile pain, penile swelling, scrotal swelling and testicular pain.  Musculoskeletal: Negative for arthralgias, joint  swelling and myalgias.  Skin: Negative for rash and wound.  Allergic/Immunologic: Negative for immunocompromised state.  Neurological: Negative for weakness.  All other systems reviewed and are negative.    Physical Exam Updated Vital Signs BP (!) 148/89 (BP Location: Right Arm)   Pulse 93   Temp 98.6 F (37 C) (Oral)   Resp 16   SpO2 100%   Physical Exam  Constitutional: He is oriented to person, place, and time. He appears well-developed and well-nourished. No distress.  HENT:  Head: Normocephalic and atraumatic.  Pulmonary/Chest: Effort normal.  Genitourinary:  Genitourinary Comments: Defers exam.  Neurological: He is alert and oriented to person, place, and time.  Skin: He is not diaphoretic.  Psychiatric: He has a normal mood and affect. His behavior is normal.  Nursing note and vitals reviewed.    ED Treatments / Results  Labs (all labs ordered are listed, but only abnormal results are displayed) Labs Reviewed - No data to display  EKG None  Radiology No results found.  Procedures Procedures (including critical care time)  Medications Ordered in ED Medications  cefTRIAXone (ROCEPHIN) injection 250 mg (has no administration in time range)  azithromycin (ZITHROMAX) powder 1 g (has no administration in time range)     Initial Impression / Assessment and Plan / ED Course  I have reviewed the triage vital signs and the nursing notes.  Pertinent labs & imaging results that were available during my care of the patient  were reviewed by me and considered in my medical decision making (see chart for details).  Clinical Course as of Jan 22 1425  Sat Jan 22, 2018  [id number] 37 year old male seen yesterday for STD testing, returns today requesting treatment.  We have documentation from yesterday states that patient was asymptomatic and just needing testing, patient is adamant that he was symptomatic yesterday and would now like to be treated.  Has a history of  gonorrhea.  He was given Rocephin and Zithromax while in the ER today and advised to abstain from intercourse, follow-up with health department, follow-up with his test results from yesterday.   [LM]    Clinical Course User Index [LM] Jeannie Fend, PA-C    Final Clinical Impressions(s) / ED Diagnoses   Final diagnoses:  Penile discharge  Concern about STD in male without diagnosis    ED Discharge Orders    None       Jeannie Fend, PA-C 01/22/18 1426    Gilda Crease, MD 01/23/18 [phone]

## 2018-01-22 NOTE — ED Triage Notes (Signed)
Pt. Stated, Alex Quinn Atlasve been exposed to STD

## 2018-01-22 NOTE — ED Triage Notes (Signed)
Pt. Stated, I was here yesterday for the same but was not treated

## 2018-01-24 LAB — GC/CHLAMYDIA PROBE AMP (~~LOC~~) NOT AT ARMC
Chlamydia: NEGATIVE
Neisseria Gonorrhea: NEGATIVE

## 2018-01-29 IMAGING — CR DG SHOULDER 2+V*R*
2 series · 2 of 2 positions shown · non-contrast
Comparison: 10/12/2014

CLINICAL DATA: Patient states that he thinks he has dislocated his
right shoulder when lifting something at work today, pain with any
ROM. Has hx of multiple dislocations of right shoulder. No surgery.

EXAM:
RIGHT SHOULDER - 2+ VIEW

[shoulder grashey]
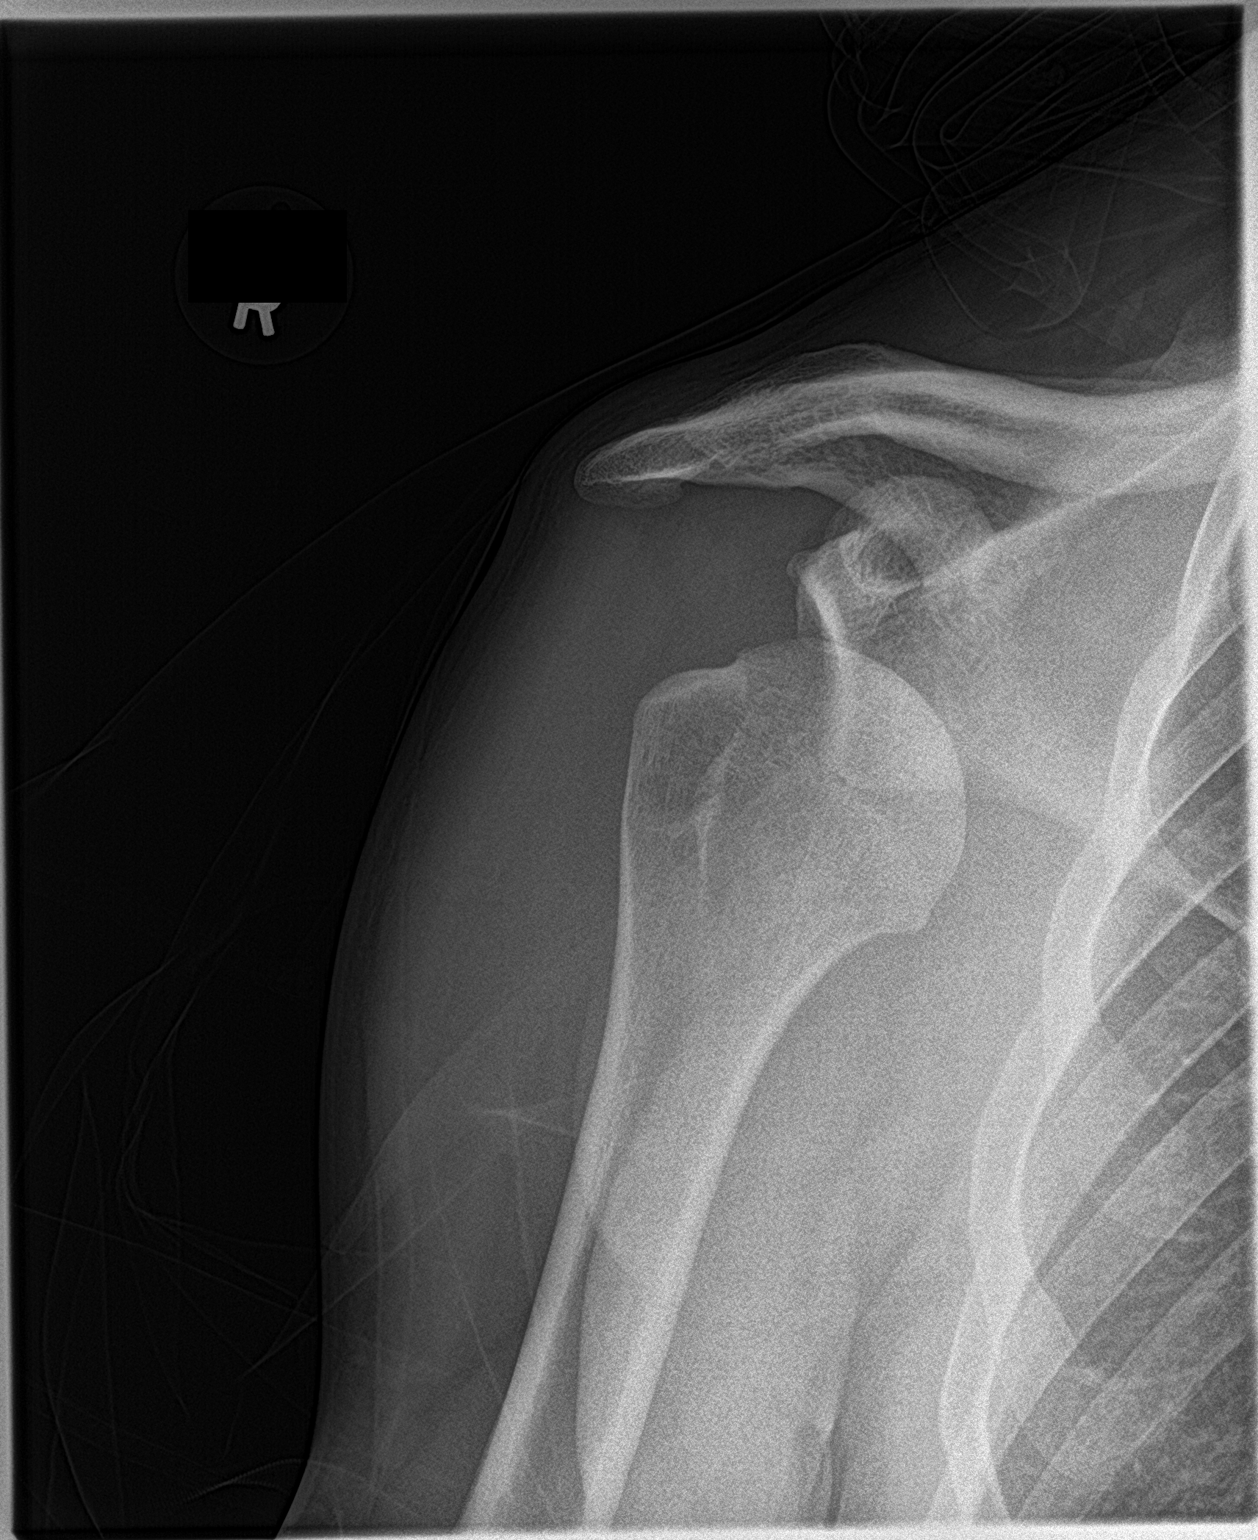

[shoulder y view]
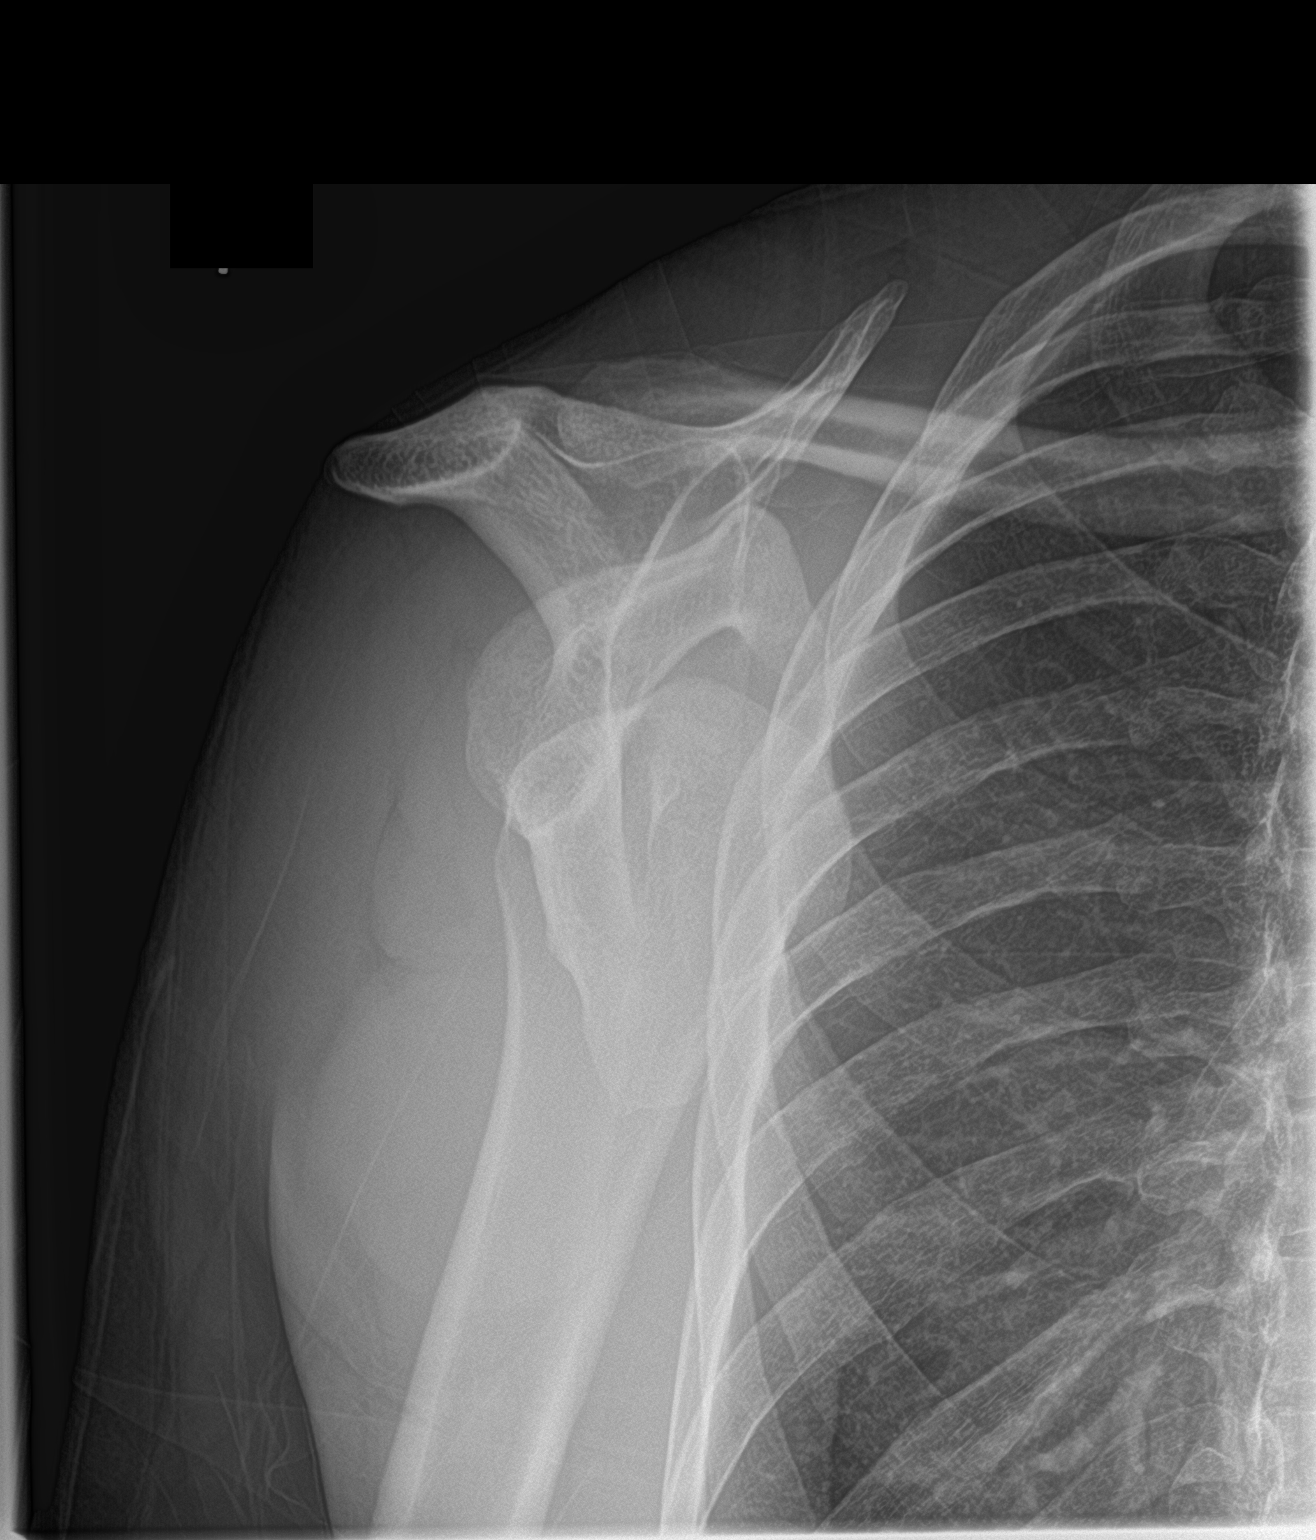

[2 of 2 positions shown; findings below may reference images not displayed]

FINDINGS: There is anterior dislocation of the right shoulder, associated with
a Hill-Sachs deformity. No other fractures are identified. The right
lung apex is clear.
IMPRESSION: Anterior dislocation of the right shoulder.

## 2018-12-22 ENCOUNTER — Emergency Department (HOSPITAL_COMMUNITY)
Admission: EM | Admit: 2018-12-22 | Discharge: 2018-12-22 | Disposition: A | Discharge status: Home / Self Care | Payer: Medicaid Other | Attending: Emergency Medicine | Admitting: Emergency Medicine

## 2018-12-22 ENCOUNTER — Other Ambulatory Visit: Payer: Self-pay

## 2018-12-22 DIAGNOSIS — Z87891 Personal history of nicotine dependence: Secondary | ICD-10-CM | POA: Insufficient documentation

## 2018-12-22 DIAGNOSIS — R300 Dysuria: Secondary | ICD-10-CM | POA: Insufficient documentation

## 2018-12-22 DIAGNOSIS — Z202 Contact with and (suspected) exposure to infections with a predominantly sexual mode of transmission: Secondary | ICD-10-CM | POA: Insufficient documentation

## 2018-12-22 DIAGNOSIS — Z711 Person with feared health complaint in whom no diagnosis is made: Secondary | ICD-10-CM

## 2018-12-22 LAB — URINALYSIS, ROUTINE W REFLEX MICROSCOPIC
Bacteria, UA: NONE SEEN
Bilirubin Urine: NEGATIVE
Glucose, UA: NEGATIVE mg/dL
Hgb urine dipstick: NEGATIVE
Ketones, ur: 5 mg/dL — AB
Nitrite: NEGATIVE
Protein, ur: NEGATIVE mg/dL
Specific Gravity, Urine: 1.028 (ref 1.005–1.030)
pH: 6 (ref 5.0–8.0)

## 2018-12-22 MED ORDER — AZITHROMYCIN 250 MG PO TABS
1000.0000 mg | ORAL_TABLET | Freq: Once | ORAL | Status: AC
  Administered 2018-12-22: 1000 mg via ORAL
  Filled 2018-12-22: qty 4

## 2018-12-22 MED ORDER — CEFTRIAXONE SODIUM 250 MG IJ SOLR
250.0000 mg | Freq: Once | INTRAMUSCULAR | Status: AC
  Administered 2018-12-22: 250 mg via INTRAMUSCULAR
  Filled 2018-12-22: qty 250

## 2018-12-22 MED ORDER — LIDOCAINE HCL (PF) 1 % IJ SOLN
INTRAMUSCULAR | Status: AC
  Administered 2018-12-22: 1 mL
  Filled 2018-12-22: qty 5

## 2018-12-22 NOTE — Discharge Instructions (Signed)

## 2018-12-22 NOTE — ED Provider Notes (Signed)
[place] EMERGENCY DEPARTMENT Provider Note   CSN: [id number] Arrival date & time: 12/22/18  1019     History   Chief Complaint Chief Complaint  Patient presents with  . Exposure to STD    HPI Alex Quinn is a 38 y.o. male who presents for evaluation of 3 days of penile discharge and intermittent dysuria.  He states that he is currently sexually active with one partner.  They do not use protection.  He states he has not engaged in any anal intercourse.  He has not had any swelling, pain of his penis or testicles.  He is on any fevers, abdominal pain.  He has not noticed any hematuria.     The history is provided by the patient.    Past Medical History:  Diagnosis Date  . Dislocated shoulder   . Medical history non-contributory     There are no active problems to display for this patient.   Past Surgical History:  Procedure Laterality Date  . MANDIBLE FRACTURE SURGERY    . ROTATOR CUFF REPAIR     manibulation  . SHOULDER LATERJET Right 07/15/2017   Procedure: SHOULDER LATERJET;  Surgeon: Tania Ade, MD;  Location: [place];  Service: Orthopedics;  Laterality: Right;        Home Medications    Prior to Admission medications   Not on File    Family History No family history on file.  Social History Social History   Tobacco Use  . Smoking status: Former Smoker    Types: Cigarettes    Quit date: 06/30/2016    Years since quitting: 2.4  . Smokeless tobacco: Never Used  Substance Use Topics  . Alcohol use: No  . Drug use: No     Allergies   Patient has no known allergies.   Review of Systems Review of Systems  Constitutional: Negative for fever.  Gastrointestinal: Negative for abdominal pain, nausea and vomiting.  Genitourinary: Positive for discharge and dysuria. Negative for hematuria, penile swelling and scrotal swelling.  Neurological: Negative for headaches.  All other systems reviewed and are negative.     Physical Exam Updated Vital Signs BP 129/74   Pulse 68   Temp 98.3 F (36.8 C) (Oral)   Resp 16   SpO2 96%   Physical Exam Vitals signs and nursing note reviewed. Exam conducted with a chaperone present.  Constitutional:      Appearance: He is well-developed.  HENT:     Head: Normocephalic and atraumatic.  Eyes:     General: No scleral icterus.       Right eye: No discharge.        Left eye: No discharge.     Conjunctiva/sclera: Conjunctivae normal.  Pulmonary:     Effort: Pulmonary effort is normal.  Genitourinary:    Penis: Normal.      Scrotum/Testes:        Right: Tenderness or swelling not present.        Left: Tenderness or swelling not present.     Comments: The exam was performed with a chaperone present. Normal male genitalia. No evidence of rash, ulcers or lesions.  No tenderness, warmth, erythema, edema noted to bilateral testicles. Skin:    General: Skin is warm and dry.  Neurological:     Mental Status: He is alert.  Psychiatric:        Speech: Speech normal.        Behavior: Behavior normal.  ED Treatments / Results  Labs (all labs ordered are listed, but only abnormal results are displayed) Labs Reviewed  URINALYSIS, ROUTINE W REFLEX MICROSCOPIC - Abnormal; Notable for the following components:      Result Value   Ketones, ur 5 (*)    Leukocytes,Ua TRACE (*)    All other components within normal limits  GC/CHLAMYDIA PROBE AMP ([place]) NOT [facility]    EKG None  Radiology No results found.  Procedures Procedures (including critical care time)  Medications Ordered in ED Medications  cefTRIAXone (ROCEPHIN) injection 250 mg (250 mg Intramuscular Given 12/22/18 1111)  azithromycin (ZITHROMAX) tablet 1,000 mg (1,000 mg Oral Given 12/22/18 1110)  lidocaine (PF) (XYLOCAINE) 1 % injection (1 mL  Given 12/22/18 1112)     Initial Impression / Assessment and Plan / ED Course  I have reviewed the triage vital signs and the nursing notes.   Pertinent labs & imaging results that were available during my care of the patient were reviewed by me and considered in my medical decision making (see chart for details).        38 y.o. M who presents for evaluation of penile discharge and dysuria x 3 days. Patient is afebrile, non-toxic appearing, sitting comfortably on examination table. Vital signs reviewed and stable. GU exam shows no evidence of rash, testicular pain or tenderness.  No obvious discharge noted on exam.  No tenderness to palpation of the testes or epididymis to suggest orchitis or epididymitis.  STD cultures obtained and pending. Discussed with patient treatment options including treatment today or waiting until cultures returned for treatment.  Patient wishes to have treatment today.  Discussed importance of using protection when sexually active. Pt understands that they have GC/Chlamydia cultures pending and that they will need to inform all sexual partners if results return positive. Patient has been treated prophylactically. Patient with no known drug allergies. Patient had ample opportunity for questions and discussion. All patient's questions were answered with full understanding. Strict return precautions discussed. Patient expresses understanding and agreement to plan.   Portions of this note were generated with Scientist, clinical (histocompatibility and immunogenetics)Dragon dictation software. Dictation errors may occur despite best attempts at proofreading.   Final Clinical Impressions(s) / ED Diagnoses   Final diagnoses:  Concern about STD in male without diagnosis    ED Discharge Orders    None       Rosana HoesLayden, Gretta Samons A, PA-C 12/22/18 1339    Blane OharaZavitz, Joshua, MD 12/22/18 1625

## 2018-12-22 NOTE — ED Triage Notes (Signed)
Patient states he wants to be tested/treated for STD - endorses discomfort and discharge for a few days.

## 2018-12-23 LAB — GC/CHLAMYDIA PROBE AMP (~~LOC~~) NOT AT ARMC
Chlamydia: NEGATIVE
Neisseria Gonorrhea: NEGATIVE

## 2018-12-31 ENCOUNTER — Other Ambulatory Visit: Payer: Self-pay

## 2018-12-31 ENCOUNTER — Emergency Department (HOSPITAL_COMMUNITY)
Admission: EM | Admit: 2018-12-31 | Discharge: 2018-12-31 | Disposition: A | Discharge status: Home / Self Care | Payer: Medicaid Other | Attending: Emergency Medicine | Admitting: Emergency Medicine

## 2018-12-31 ENCOUNTER — Encounter (HOSPITAL_COMMUNITY): Payer: Self-pay | Admitting: Emergency Medicine

## 2018-12-31 DIAGNOSIS — R300 Dysuria: Secondary | ICD-10-CM | POA: Insufficient documentation

## 2018-12-31 DIAGNOSIS — R369 Urethral discharge, unspecified: Secondary | ICD-10-CM | POA: Insufficient documentation

## 2018-12-31 DIAGNOSIS — Z87891 Personal history of nicotine dependence: Secondary | ICD-10-CM | POA: Insufficient documentation

## 2018-12-31 DIAGNOSIS — Z202 Contact with and (suspected) exposure to infections with a predominantly sexual mode of transmission: Secondary | ICD-10-CM | POA: Insufficient documentation

## 2018-12-31 LAB — URINALYSIS, ROUTINE W REFLEX MICROSCOPIC
Bilirubin Urine: NEGATIVE
Glucose, UA: NEGATIVE mg/dL
Hgb urine dipstick: NEGATIVE
Ketones, ur: NEGATIVE mg/dL
Leukocytes,Ua: NEGATIVE
Nitrite: NEGATIVE
Protein, ur: NEGATIVE mg/dL
Specific Gravity, Urine: 1.021 (ref 1.005–1.030)
pH: 6 (ref 5.0–8.0)

## 2018-12-31 MED ORDER — CEFTRIAXONE SODIUM 250 MG IJ SOLR
250.0000 mg | Freq: Once | INTRAMUSCULAR | Status: AC
  Administered 2018-12-31: 16:00:00 250 mg via INTRAMUSCULAR
  Filled 2018-12-31: qty 250

## 2018-12-31 MED ORDER — LIDOCAINE HCL (PF) 1 % IJ SOLN
INTRAMUSCULAR | Status: AC
  Administered 2018-12-31: 5 mL
  Filled 2018-12-31: qty 5

## 2018-12-31 MED ORDER — AZITHROMYCIN 250 MG PO TABS
1000.0000 mg | ORAL_TABLET | Freq: Once | ORAL | Status: AC
  Administered 2018-12-31: 1000 mg via ORAL
  Filled 2018-12-31: qty 4

## 2018-12-31 NOTE — ED Triage Notes (Signed)
Pt presents for concerns of an STD that has come back or did not respond to antibiotics. Pt having burning during urination and discharge from his penis.

## 2018-12-31 NOTE — ED Provider Notes (Signed)
[place] EMERGENCY DEPARTMENT Provider Note   CSN: [id number] Arrival date & time: 12/31/18  1358     History   Chief Complaint Chief Complaint  Patient presents with  . SEXUALLY TRANSMITTED DISEASE    HPI Alex Quinn is a 38 y.o. male.     Alex Quinn is a 38 y.o. male who is otherwise healthy, presents to the emergency department for evaluation of dysuria and penile discharge.  He reports he has had these symptoms intermittently for about 10 days now, he was seen initially on 8/20 when symptoms first began, had concern for STD, was treated prophylactically for gonorrhea and chlamydia, although his STD testing came back negative and urinalysis did not show significant signs of infection.  He reports despite antibiotics he has continued to experience symptoms intermittently he reports intermittently seeing purulent penile discharge, he also reports that when he goes to the bathroom he notices that he is already dribbling before he starts to go.  He denies any abdominal pain, no rectal pain or pain with defecation, no fevers or chills, no nausea or vomiting.  Reports that his partner has tested negative as well and so he is concerned as to why he is still experiencing symptoms, wants to be treated again for STDs.     Past Medical History:  Diagnosis Date  . Dislocated shoulder   . Medical history non-contributory     There are no active problems to display for this patient.   Past Surgical History:  Procedure Laterality Date  . MANDIBLE FRACTURE SURGERY    . ROTATOR CUFF REPAIR     manibulation  . SHOULDER LATERJET Right 07/15/2017   Procedure: SHOULDER LATERJET;  Surgeon: Tania Ade, MD;  Location: [facility];  Service: Orthopedics;  Laterality: Right;        Home Medications    Prior to Admission medications   Not on File    Family History No family history on file.  Social History Social History   Tobacco Use  . Smoking  status: Former Smoker    Types: Cigarettes    Quit date: 06/30/2016    Years since quitting: 2.5  . Smokeless tobacco: Never Used  Substance Use Topics  . Alcohol use: No  . Drug use: No     Allergies   Patient has no known allergies.   Review of Systems Review of Systems  Constitutional: Negative for chills and fever.  Respiratory: Negative for cough and shortness of breath.   Cardiovascular: Negative for chest pain.  Gastrointestinal: Negative for abdominal pain, constipation, diarrhea, nausea, rectal pain and vomiting.  Genitourinary: Positive for discharge and dysuria. Negative for frequency, genital sores, hematuria, penile pain, penile swelling, scrotal swelling and testicular pain.  Musculoskeletal: Negative for arthralgias and myalgias.  Skin: Negative for color change and rash.     Physical Exam Updated Vital Signs BP (!) 141/97   Pulse 82   Temp 98.8 F (37.1 C) (Oral)   Resp 16   Ht 6' (1.829 m)   Wt 97.5 kg   SpO2 96%   BMI 29.16 kg/m   Physical Exam Vitals signs and nursing note reviewed.  Constitutional:      General: He is not in acute distress.    Appearance: He is well-developed. He is not diaphoretic.  HENT:     Head: Normocephalic and atraumatic.  Eyes:     General:        Right eye: No discharge.  Left eye: No discharge.  Pulmonary:     Effort: Pulmonary effort is normal. No respiratory distress.  Abdominal:     General: Abdomen is flat. Bowel sounds are normal. There is no distension.     Palpations: Abdomen is soft. There is no mass.     Tenderness: There is no abdominal tenderness. There is no guarding.     Comments: Abdomen soft, nondistended, nontender to palpation in all quadrants without guarding or peritoneal signs  Genitourinary:    Comments: Chaperone present during genital exam. No external genital lesions noted. No penile tenderness or swelling, there is a small amount of clear discharge noted at the urinary meatus, no  purulence.  There is no tenderness swelling or erythema over the testes or scrotum.  No inguinal lymphadenopathy. Skin:    General: Skin is warm and dry.     Findings: No rash.  Neurological:     Mental Status: He is alert and oriented to person, place, and time.     Coordination: Coordination normal.  Psychiatric:        Mood and Affect: Mood normal.        Behavior: Behavior normal.      ED Treatments / Results  Labs (all labs ordered are listed, but only abnormal results are displayed) Labs Reviewed  URINE CULTURE  URINALYSIS, ROUTINE W REFLEX MICROSCOPIC  HIV ANTIBODY (ROUTINE TESTING W REFLEX)  RPR  GC/CHLAMYDIA PROBE AMP ([place]) NOT AT [facility]    EKG None  Radiology No results found.  Procedures Procedures (including critical care time)  Medications Ordered in ED Medications  cefTRIAXone (ROCEPHIN) injection 250 mg (250 mg Intramuscular Given 12/31/18 1616)  azithromycin (ZITHROMAX) tablet 1,000 mg (1,000 mg Oral Given 12/31/18 1613)  lidocaine (PF) (XYLOCAINE) 1 % injection (5 mLs  Given 12/31/18 1616)     Initial Impression / Assessment and Plan / ED Course  I have reviewed the triage vital signs and the nursing notes.  Pertinent labs & imaging results that were available during my care of the patient were reviewed by me and considered in my medical decision making (see chart for details).  Patient is afebrile without abdominal tenderness, abdominal pain or painful bowel movements to indicate prostatitis.  No tenderness to palpation of the testes or epididymis to suggest orchitis or epididymitis.  STD cultures obtained including HIV, syphilis, gonorrhea and chlamydia.  Urinalysis without signs of infection.discussed with patient since this is his second visit if STD testing comes back negative and he continues to have symptoms that he should follow-up with the urologist.  Discussed importance of using protection when sexually active. Pt understands that they  have GC/Chlamydia cultures pending and that they will need to inform all sexual partners if results return positive. Patient has been treated prophylactically with azithromycin and Rocephin per patient's request.   Final Clinical Impressions(s) / ED Diagnoses   Final diagnoses:  Dysuria    ED Discharge Orders    None       Dartha LodgeFord, Daily Doe N, [place] JerseyPA-C 12/31/18 1703    Rolan BuccoBelfi, Melanie, MD 12/31/18 [phone]

## 2018-12-31 NOTE — ED Notes (Signed)
Pt changed into a gown.

## 2018-12-31 NOTE — Discharge Instructions (Signed)
Urinalysis does not show signs of infection, you do have STD testing pending and you were treated again today for gonorrhea and chlamydia.  If your symptoms persist but your STD testing comes back negative I would like for you to follow-up with urology for further evaluation of your symptoms.

## 2019-01-01 LAB — RPR: RPR Ser Ql: NONREACTIVE

## 2019-01-01 LAB — HIV ANTIBODY (ROUTINE TESTING W REFLEX): HIV Screen 4th Generation wRfx: NONREACTIVE

## 2019-01-01 LAB — URINE CULTURE: Culture: 40000 — AB

## 2019-01-03 LAB — GC/CHLAMYDIA PROBE AMP (~~LOC~~) NOT AT ARMC
Chlamydia: NEGATIVE
Neisseria Gonorrhea: NEGATIVE

## 2019-01-11 ENCOUNTER — Ambulatory Visit (HOSPITAL_COMMUNITY)
Admission: EM | Admit: 2019-01-11 | Discharge: 2019-01-11 | Disposition: A | Discharge status: Home / Self Care | Payer: Medicaid Other | Attending: Family Medicine | Admitting: Family Medicine

## 2019-01-11 ENCOUNTER — Encounter (HOSPITAL_COMMUNITY): Payer: Self-pay | Admitting: Family Medicine

## 2019-01-11 ENCOUNTER — Other Ambulatory Visit: Payer: Self-pay

## 2019-01-11 DIAGNOSIS — N3001 Acute cystitis with hematuria: Secondary | ICD-10-CM | POA: Insufficient documentation

## 2019-01-11 LAB — POCT URINALYSIS DIP (DEVICE)
Bilirubin Urine: NEGATIVE
Glucose, UA: NEGATIVE mg/dL
Ketones, ur: NEGATIVE mg/dL
Nitrite: NEGATIVE
Protein, ur: NEGATIVE mg/dL
Specific Gravity, Urine: 1.015 (ref 1.005–1.030)
Urobilinogen, UA: 0.2 mg/dL (ref 0.0–1.0)
pH: 6.5 (ref 5.0–8.0)

## 2019-01-11 MED ORDER — SULFAMETHOXAZOLE-TRIMETHOPRIM 800-160 MG PO TABS: 1.0000 | ORAL_TABLET | Freq: Two times a day (BID) | ORAL | 0 refills | Status: AC

## 2019-01-11 NOTE — ED Triage Notes (Signed)
Pt states he has been back and forward with STD testing everything has been normal.  Pt states he has been looking things up on goggle.

## 2019-01-11 NOTE — Discharge Instructions (Addendum)
I believe that you have a urinary tract infection Treating you with antibiotics.  Make sure you are drinking lots of water.  We will send the urine for culture and call you if we need to change anything.  Otherwise follow up with urology.

## 2019-01-12 NOTE — ED Provider Notes (Signed)
[facility]    CSN: [id number] Arrival date & time: 01/11/19  1502      History   Chief Complaint Chief Complaint  Patient presents with  . Penile Discharge    HPI Alex Quinn is a 38 y.o. male.   Patient is a 39 year old male the presents today with dysuria, leakage of urine.  Symptoms have been constant, waxing waning over the past couple weeks.  He has been seen twice and tested for STDs and treated which results were negative.  He is concerned because he is still having symptoms.  Reporting a little dribbling of urine in the morning times.  Intermittent dysuria.  No hematuria, penile discharge.  No abdominal pain, back pain or fevers.  Currently sexually active in a monogamous relationship.  Side note previous urine that was done was cultured and positive for staph  ROS per HPI      Past Medical History:  Diagnosis Date  . Dislocated shoulder   . Medical history non-contributory     There are no active problems to display for this patient.   Past Surgical History:  Procedure Laterality Date  . MANDIBLE FRACTURE SURGERY    . ROTATOR CUFF REPAIR     manibulation  . SHOULDER LATERJET Right 07/15/2017   Procedure: SHOULDER LATERJET;  Surgeon: Tania Ade, MD;  Location: [facility];  Service: Orthopedics;  Laterality: Right;       Home Medications    Prior to Admission medications   Medication Sig Start Date End Date Taking? Authorizing Provider  sulfamethoxazole-trimethoprim (BACTRIM DS) 800-160 MG tablet Take 1 tablet by mouth 2 (two) times daily for 7 days. 01/11/19 01/18/19  Orvan July, NP    Family History No family history on file.  Social History Social History   Tobacco Use  . Smoking status: Former Smoker    Types: Cigarettes    Quit date: 06/30/2016    Years since quitting: 2.5  . Smokeless tobacco: Never Used  Substance Use Topics  . Alcohol use: No  . Drug use: No     Allergies   Patient has no known allergies.    Review of Systems Review of Systems   Physical Exam Triage Vital Signs ED Triage Vitals  Enc Vitals Group     BP 01/11/19 1535 (!) 138/96     Pulse Rate 01/11/19 1535 78     Resp 01/11/19 1535 16     Temp 01/11/19 1535 97.8 F (36.6 C)     Temp Source 01/11/19 1535 Temporal     SpO2 01/11/19 1535 100 %     Weight 01/11/19 1545 215 lb (97.5 kg)     Height --      Head Circumference --      Peak Flow --      Pain Score 01/11/19 1545 0     Pain Loc --      Pain Edu? --      Excl. in [place]? --    No data found.  Updated Vital Signs BP 126/86 (BP Location: Right Arm)   Pulse 66   Temp 98.1 F (36.7 C) (Oral)   Resp 18   Wt 215 lb (97.5 kg)   SpO2 98%   BMI 29.16 kg/m   Visual Acuity Right Eye Distance:   Left Eye Distance:   Bilateral Distance:    Right Eye Near:   Left Eye Near:    Bilateral Near:     Physical Exam Vitals signs  and nursing note reviewed.  Constitutional:      Appearance: Normal appearance.  HENT:     Head: Normocephalic and atraumatic.     Nose: Nose normal.  Eyes:     Conjunctiva/sclera: Conjunctivae normal.  Neck:     Musculoskeletal: Normal range of motion.  Pulmonary:     Effort: Pulmonary effort is normal.  Abdominal:     Palpations: Abdomen is soft.     Tenderness: There is no abdominal tenderness.  Genitourinary:    Comments: Deferred  Musculoskeletal: Normal range of motion.  Skin:    General: Skin is warm and dry.  Neurological:     Mental Status: He is alert.  Psychiatric:        Mood and Affect: Mood normal.      UC Treatments / Results  Labs (all labs ordered are listed, but only abnormal results are displayed) Labs Reviewed  POCT URINALYSIS DIP (DEVICE) - Abnormal; Notable for the following components:      Result Value   Hgb urine dipstick TRACE (*)    Leukocytes,Ua TRACE (*)    All other components within normal limits  URINE CULTURE    EKG   Radiology No results found.  Procedures Procedures  (including critical care time)  Medications Ordered in UC Medications - No data to display  Initial Impression / Assessment and Plan / UC Course  I have reviewed the triage vital signs and the nursing notes.  Pertinent labs & imaging results that were available during my care of the patient were reviewed by me and considered in my medical decision making (see chart for details).     Urine here with trace leuks and trace blood Sending for re culture Will go ahead and treat for UTI in the meantime with Bactrim Recommended pushing fluids No need to retest for STDs today. If symptoms continue despite this treat with antibiotics he will need to follow-up with urology. Pt understanding and agreed.  Final Clinical Impressions(s) / UC Diagnoses   Final diagnoses:  Acute cystitis with hematuria     Discharge Instructions     I believe that you have a urinary tract infection Treating you with antibiotics.  Make sure you are drinking lots of water.  We will send the urine for culture and call you if we need to change anything.  Otherwise follow up with urology.     ED Prescriptions    Medication Sig Dispense Auth. Provider   sulfamethoxazole-trimethoprim (BACTRIM DS) 800-160 MG tablet Take 1 tablet by mouth 2 (two) times daily for 7 days. 14 tablet Janace ArisBast, Kersten Salmons A, NP     Controlled Substance Prescriptions [place] Controlled Substance Registry consulted? no   Janace ArisBast, Jullien Granquist A, NP 01/12/19 1413

## 2019-01-13 LAB — URINE CULTURE: Culture: NO GROWTH

## 2019-01-21 IMAGING — DX DG SHOULDER 2+V*R*
3 series · 3 of 3 positions shown · non-contrast
Comparison: 05/09/2017

CLINICAL DATA: Right shoulder pain and dislocation. Initial
encounter.

EXAM:
RIGHT SHOULDER - 2+ VIEW

[shoulder y view]
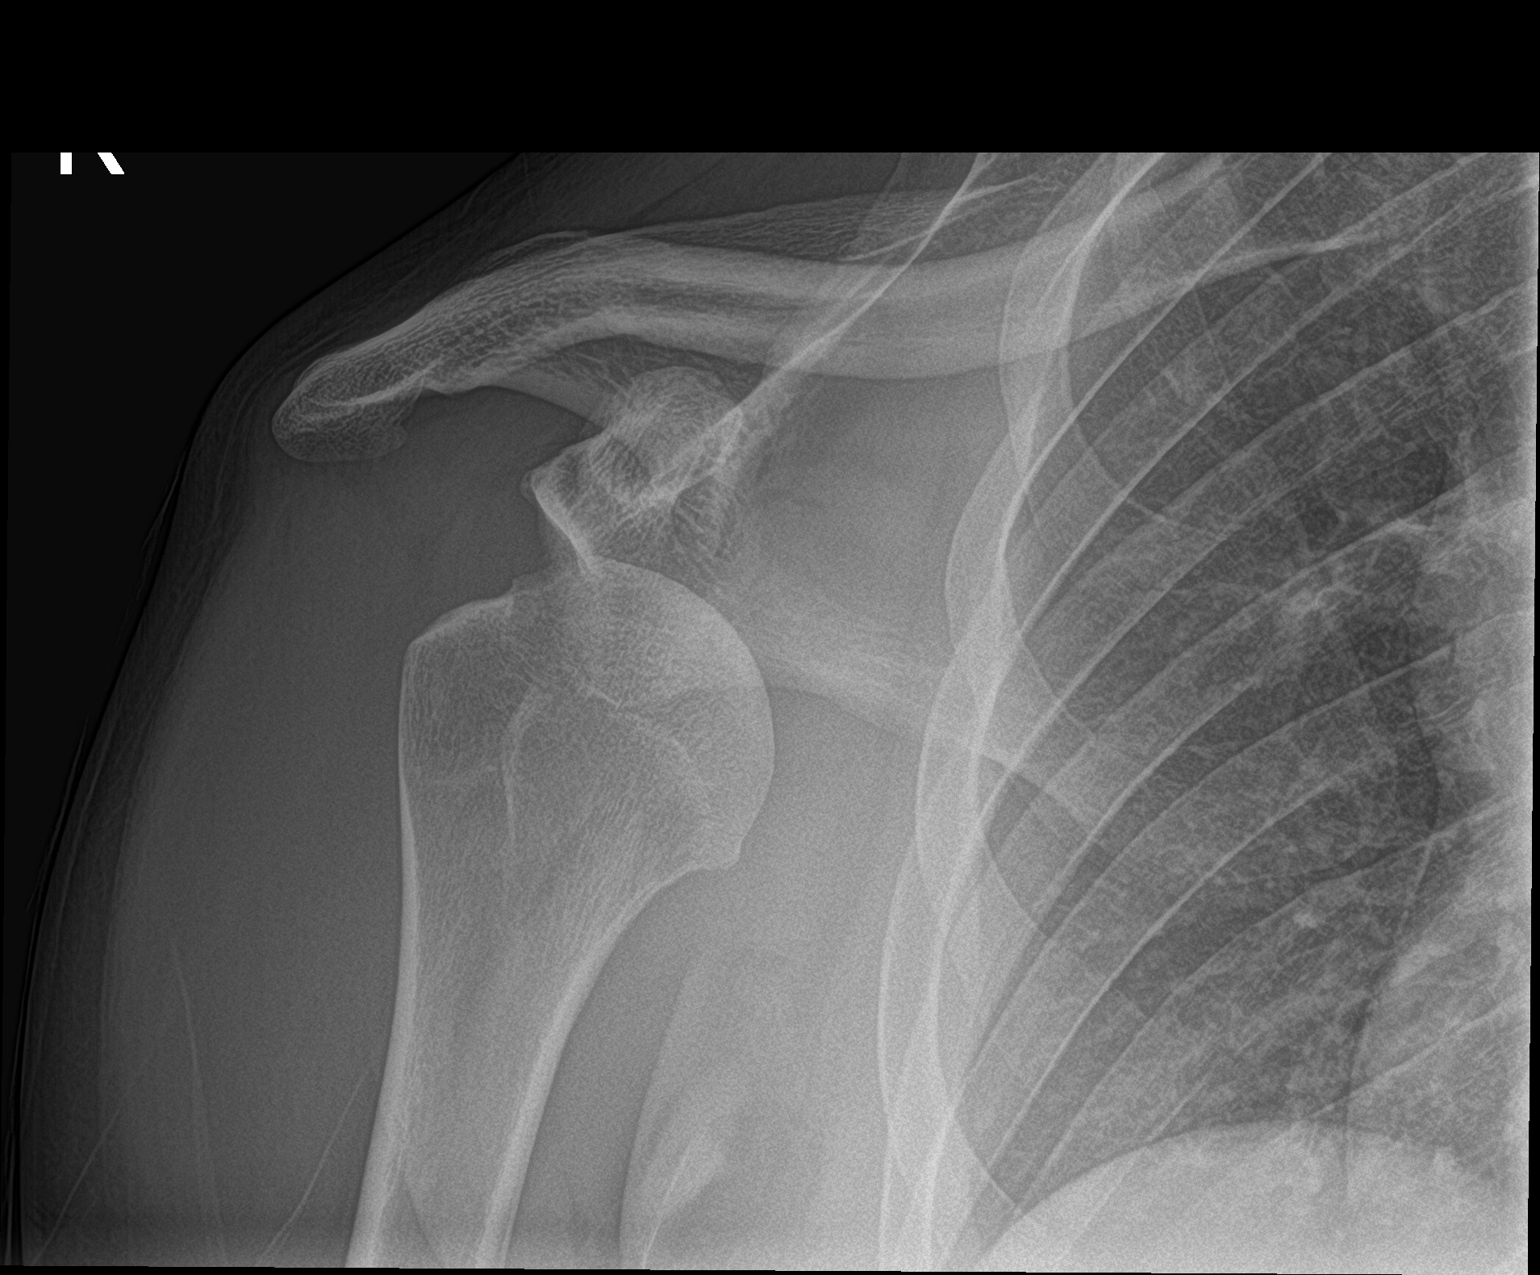

[shoulder ap neutral]
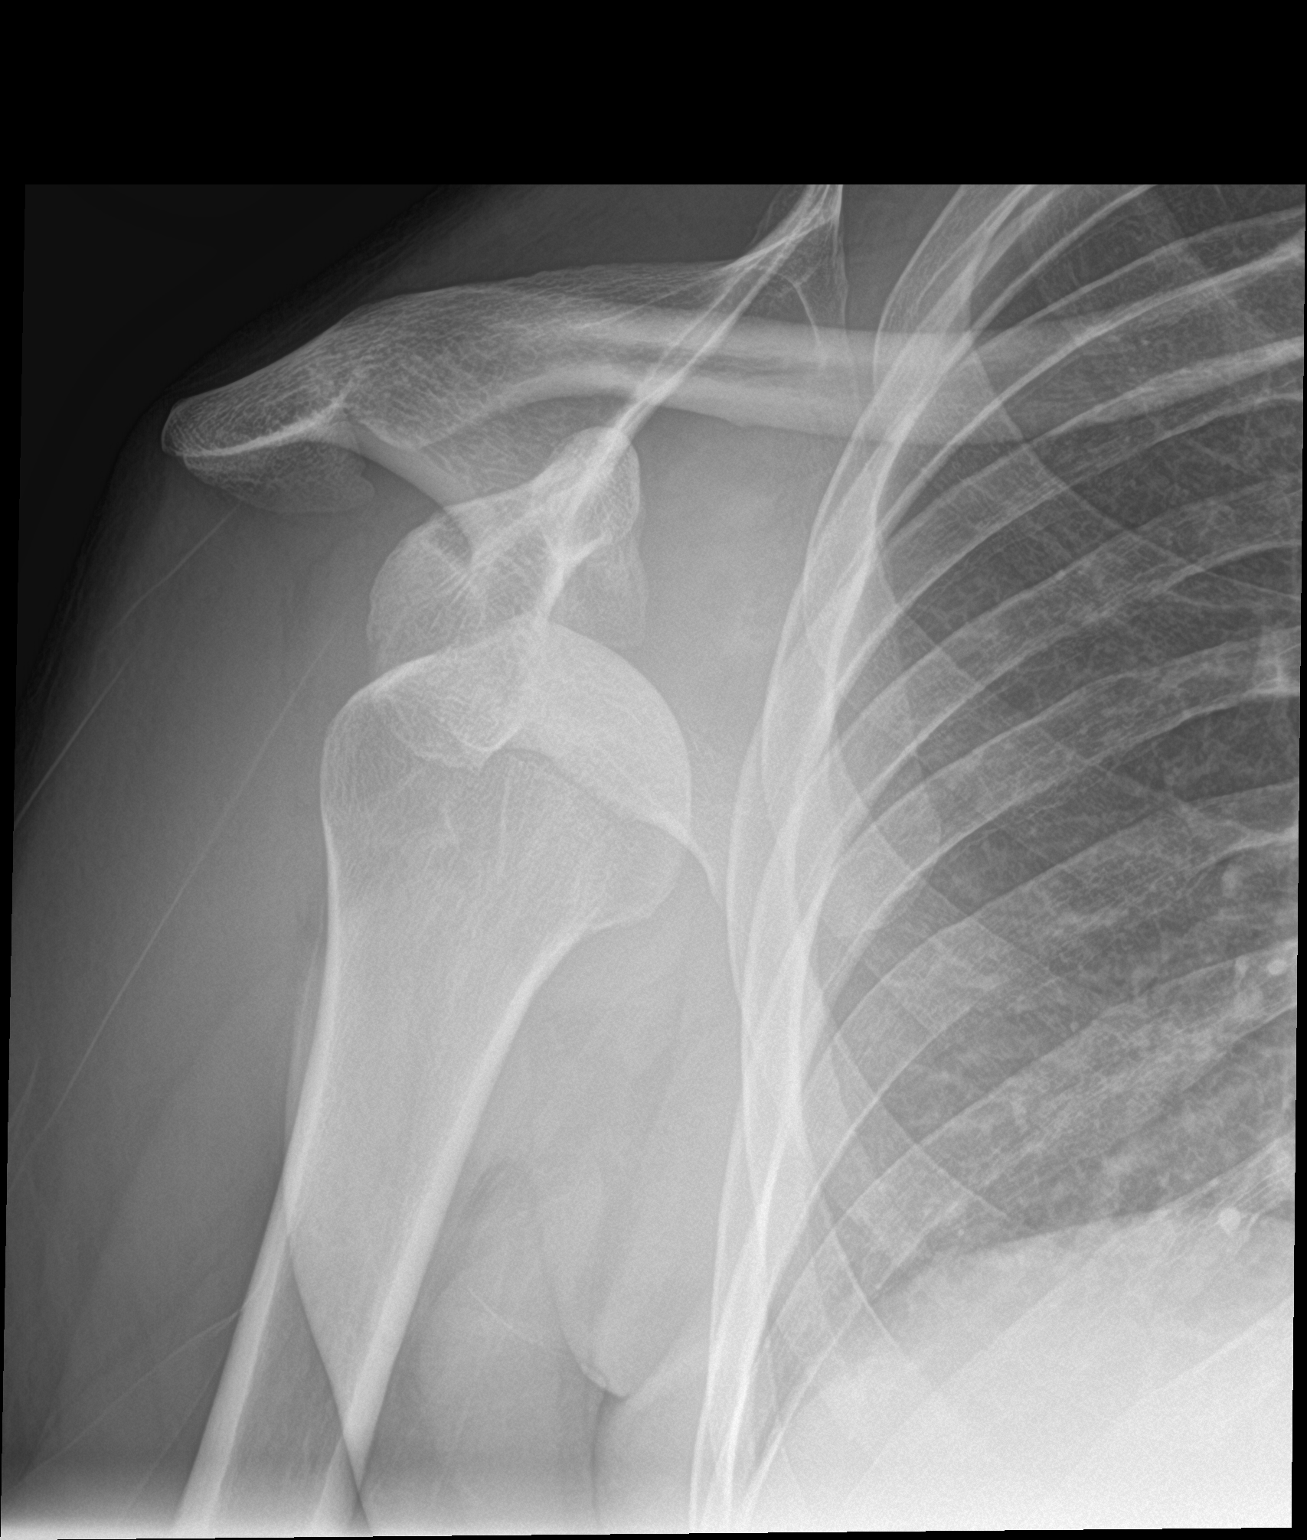

[shoulder grashey]
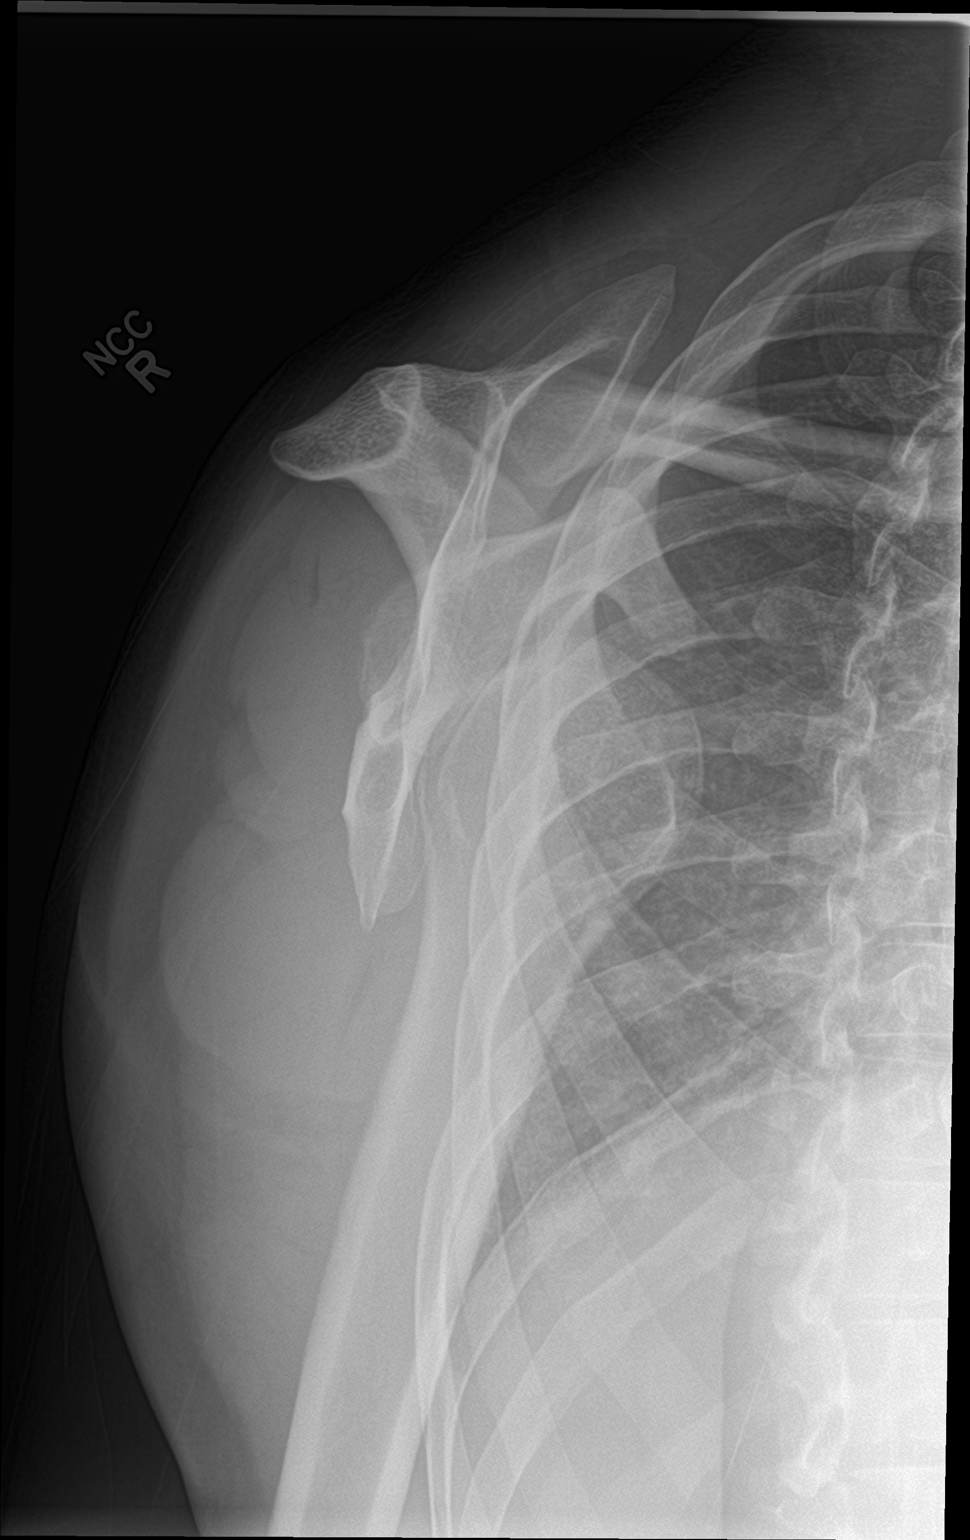

[3 of 3 positions shown; findings below may reference images not displayed]

FINDINGS: Anterior dislocation of the right humeral head is seen. Chronic
Hill-Sachs deformity of the humeral head noted. No evidence of acute
fracture.
IMPRESSION: Anterior dislocation of right shoulder. No evidence of acute
fracture.

## 2019-01-22 IMAGING — DX DG SHOULDER 2+V PORT*R*
1 series · 1 of 1 positions shown · non-contrast
Comparison: Shoulder radiograph 05/30/2017

CLINICAL DATA: Reduction of shoulder dislocation

EXAM:
PORTABLE RIGHT SHOULDER

[shoulder ap]
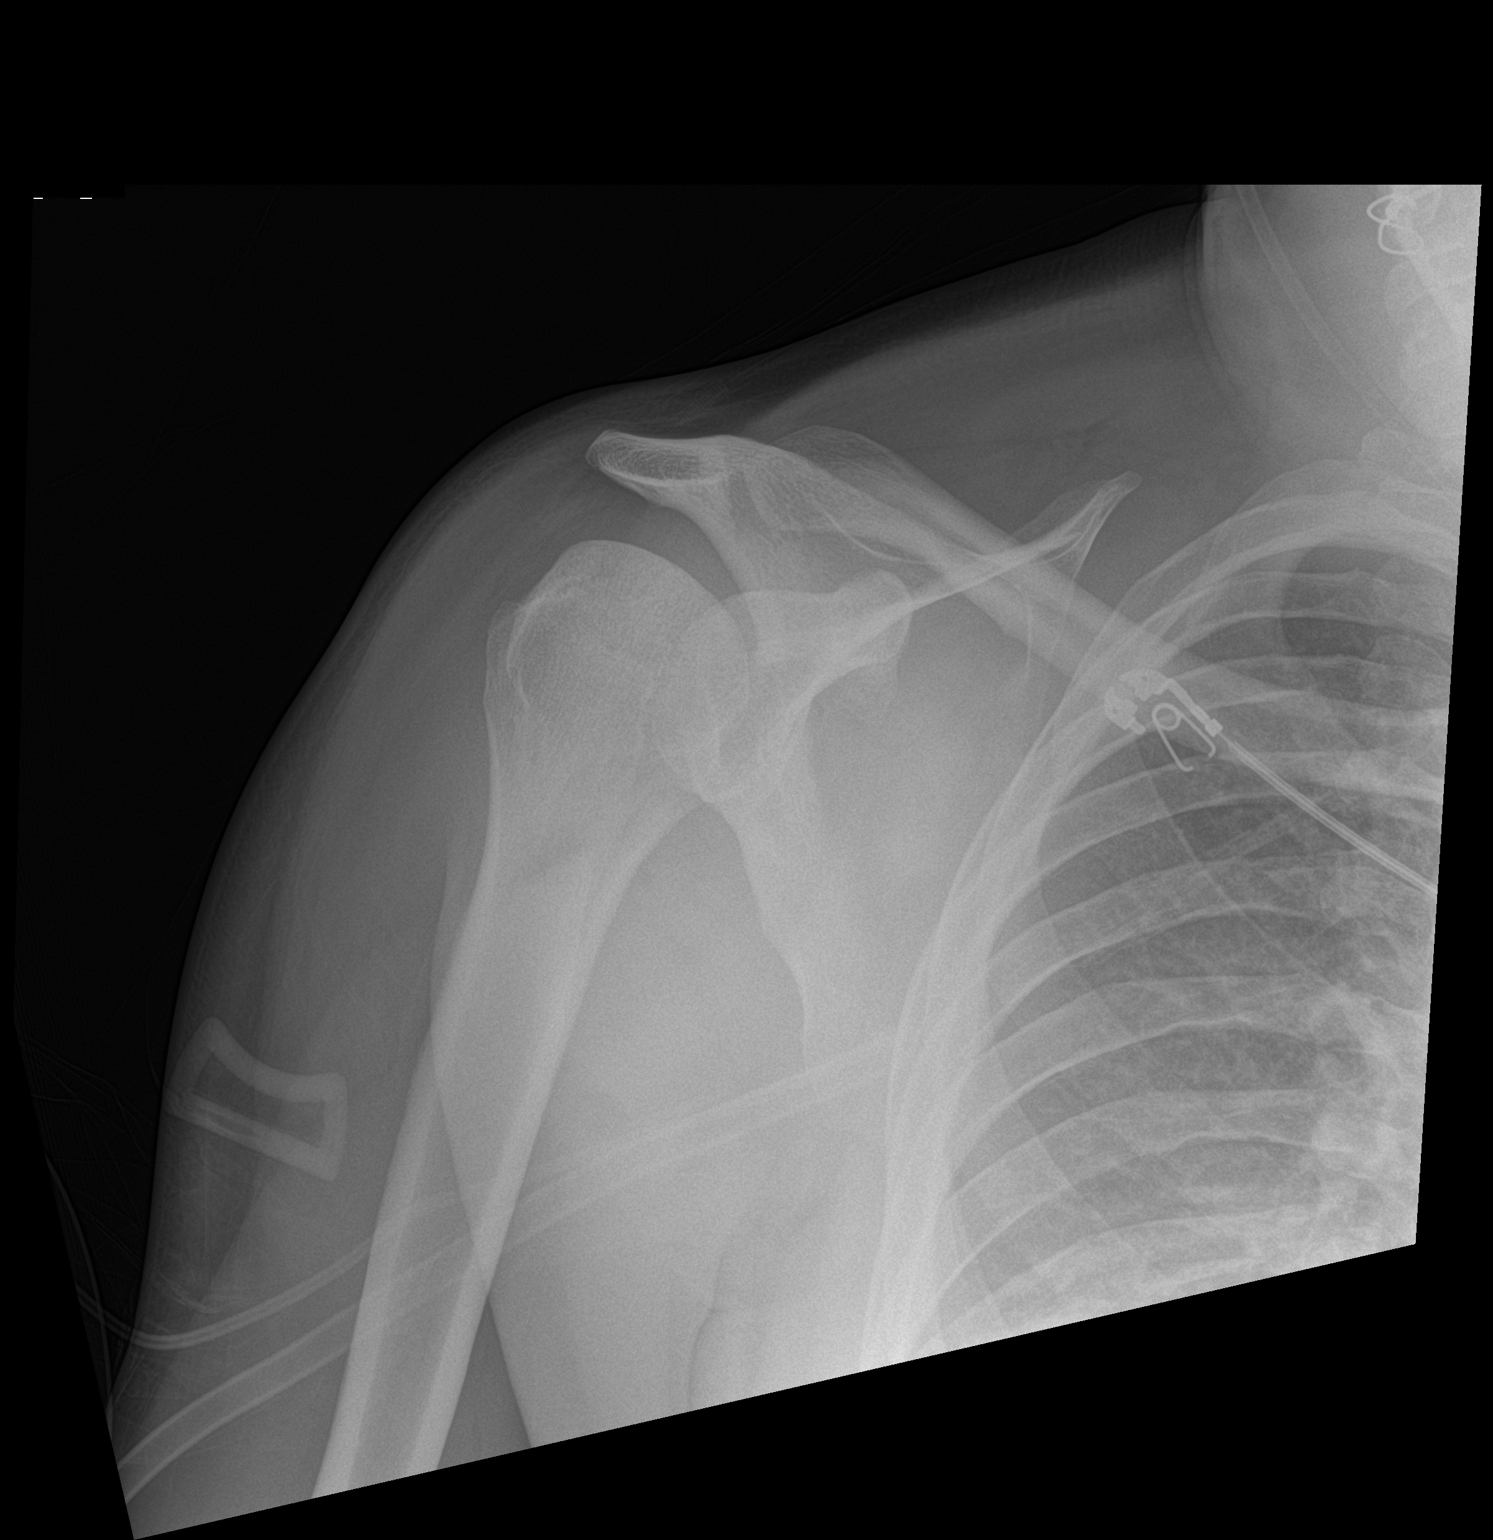

[1 of 1 positions shown; findings below may reference images not displayed]

FINDINGS: There is improved alignment at the glenohumeral joint following
shoulder dislocation reduction. There is no visible fracture.
IMPRESSION: Improved alignment of the glenohumeral joint.

## 2019-12-25 ENCOUNTER — Ambulatory Visit (HOSPITAL_COMMUNITY)
Admission: EM | Admit: 2019-12-25 | Discharge: 2019-12-25 | Disposition: A | Discharge status: Home / Self Care | Payer: Medicaid Other | Attending: Physician Assistant | Admitting: Physician Assistant

## 2019-12-25 ENCOUNTER — Encounter (HOSPITAL_COMMUNITY): Payer: Self-pay

## 2019-12-25 ENCOUNTER — Other Ambulatory Visit: Payer: Self-pay

## 2019-12-25 DIAGNOSIS — M25512 Pain in left shoulder: Secondary | ICD-10-CM

## 2019-12-25 MED ORDER — PREDNISONE 10 MG PO TABS: 40.0000 mg | ORAL_TABLET | Freq: Every day | ORAL | 0 refills | Status: AC

## 2019-12-25 MED ORDER — ACETAMINOPHEN 500 MG PO TABS: 1000.0000 mg | ORAL_TABLET | Freq: Three times a day (TID) | ORAL | 0 refills | Status: DC | PRN

## 2019-12-25 MED ORDER — DICLOFENAC SODIUM 1 % EX GEL: 4.0000 g | Freq: Four times a day (QID) | CUTANEOUS | 0 refills | Status: DC

## 2019-12-25 NOTE — ED Triage Notes (Signed)
Pt presents with severe left shoulder, weakness, and limited ROM.  Pt states he had the same thing happened to his right shoulder a few years ago and he had to get surgery on it.

## 2019-12-25 NOTE — Discharge Instructions (Signed)
Call the sports medicine group tomorrow for follow up in the next few days if possible  Wear sling, ice and rest shoulder  Take medications as prescribed

## 2019-12-25 NOTE — ED Provider Notes (Signed)
[facility]    CSN: [id number] Arrival date & time: 12/25/19  1407      History   Chief Complaint Chief Complaint  Patient presents with   Shoulder Pain    HPI Alex Quinn is a 39 y.o. male.   Patient presents with left shoulder pain.  He reports shoulders given him trouble on and off over the years.  He reports recently his shoulders become more of a problem.  Reports he has dislocated the right shoulder, but never in the left.  No direct injuries to the left shoulder.  Reports achy pain in the front and the back of the shoulder.  Reports decreased mobility due to the pain.  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  Denies numbness, tingling or weakness below the shoulder.  No neck pain or neck injuries.  No falls or traumas to the shoulder.  No radiation in the chest or shortness of breath.     Past Medical History:  Diagnosis Date   Dislocated shoulder    Medical history non-contributory     There are no problems to display for this patient.   Past Surgical History:  Procedure Laterality Date   MANDIBLE FRACTURE SURGERY     ROTATOR CUFF REPAIR     manibulation   SHOULDER LATERJET Right 07/15/2017   Procedure: SHOULDER LATERJET;  Surgeon: Jones Broom, MD;  Location: [facility] OR;  Service: Orthopedics;  Laterality: Right;       Home Medications    Prior to Admission medications   Medication Sig Start Date End Date Taking? Authorizing Provider  acetaminophen (TYLENOL) 500 MG tablet Take 2 tablets (1,000 mg total) by mouth every 8 (eight) hours as needed. 12/25/19   Nour Rodrigues, Veryl Speak, PA-C  diclofenac Sodium (VOLTAREN) 1 % GEL Apply 4 g topically 4 (four) times daily. 12/25/19   Tearia Gibbs, Veryl Speak, PA-C  predniSONE (DELTASONE) 10 MG tablet Take 4 tablets (40 mg total) by mouth daily with breakfast for 5 days. 12/25/19 12/30/19  Eldo Umanzor, Veryl Speak, PA-C    Family History Family History  Family history unknown: Yes    Social  History Social History   Tobacco Use   Smoking status: Former Smoker    Types: Cigarettes    Quit date: 06/30/2016    Years since quitting: 3.4   Smokeless tobacco: Never Used  Vaping Use   Vaping Use: Never used  Substance Use Topics   Alcohol use: No   Drug use: No     Allergies   Patient has no known allergies.   Review of Systems Review of Systems   Physical Exam Triage Vital Signs ED Triage Vitals  Enc Vitals Group     BP 12/25/19 1526 (!) 136/114     Pulse Rate 12/25/19 1526 65     Resp 12/25/19 1526 18     Temp 12/25/19 1526 98.4 F (36.9 C)     Temp Source 12/25/19 1526 Oral     SpO2 12/25/19 1526 98 %     Weight --      Height --      Head Circumference --      Peak Flow --      Pain Score 12/25/19 1525 9     Pain Loc --      Pain Edu? --      Excl. in [place]? --    No data found.  Updated Vital Signs BP (!) 136/114 (BP Location: Right Arm)  Pulse 65    Temp 98.4 F (36.9 C) (Oral)    Resp 18    SpO2 98%   Visual Acuity Right Eye Distance:   Left Eye Distance:   Bilateral Distance:    Right Eye Near:   Left Eye Near:    Bilateral Near:     Physical Exam Vitals and nursing note reviewed.  Constitutional:      Appearance: Normal appearance.  Cardiovascular:     Rate and Rhythm: Normal rate.  Pulmonary:     Effort: Pulmonary effort is normal. No respiratory distress.  Musculoskeletal:     Comments: Left shoulder without deformity, swelling.  Limited range of motion to 90 degrees of forward flexion and abduction active range of motion.  Full range of motion with passive range of motion.  No bony tenderness.  There is tenderness in the posterior and anterior joint spaces.  Speeds test negative.  Empty can weakly positive.  Unable to place arm behind back.  Some pain elicited with this did external rotation.  5 out of 5 strength at the elbow and wrist.  Sensation intact.  Distal pulses 2+.  Skin:    General: Skin is warm and dry.   Neurological:     Mental Status: He is alert.      UC Treatments / Results  Labs (all labs ordered are listed, but only abnormal results are displayed) Labs Reviewed - No data to display  EKG   Radiology No results found.  Procedures Procedures (including critical care time)  Medications Ordered in UC Medications - No data to display  Initial Impression / Assessment and Plan / UC Course  I have reviewed the triage vital signs and the nursing notes.  Pertinent labs & imaging results that were available during my care of the patient were reviewed by me and considered in my medical decision making (see chart for details).     #Left shoulder pain Patient is 39 year old presenting with left shoulder pain.  Suspicious for rotator cuff pathology versus capsular pain.  Will place on prednisone and give sling for comfort.  Will have follow-up with sports medicine.  Discussed rest and ice.  Patient verbalized understand plan of care. Final Clinical Impressions(s) / UC Diagnoses   Final diagnoses:  Acute pain of left shoulder     Discharge Instructions     Call the sports medicine group tomorrow for follow up in the next few days if possible  Wear sling, ice and rest shoulder  Take medications as prescribed      ED Prescriptions    Medication Sig Dispense Auth. Provider   predniSONE (DELTASONE) 10 MG tablet Take 4 tablets (40 mg total) by mouth daily with breakfast for 5 days. 20 tablet Jair Lindblad, Veryl Speak, PA-C   diclofenac Sodium (VOLTAREN) 1 % GEL Apply 4 g topically 4 (four) times daily. 100 g Kaveri Perras, Veryl Speak, PA-C   acetaminophen (TYLENOL) 500 MG tablet Take 2 tablets (1,000 mg total) by mouth every 8 (eight) hours as needed. 30 tablet [name], Veryl Speak, PA-C     PDMP not reviewed this encounter.   Hermelinda Medicus, PA-C 12/26/19 [phone]

## 2022-04-17 ENCOUNTER — Encounter (HOSPITAL_COMMUNITY): Payer: Self-pay

## 2022-04-17 ENCOUNTER — Emergency Department (HOSPITAL_COMMUNITY)
Admission: EM | Admit: 2022-04-17 | Discharge: 2022-04-17 | Disposition: A | Discharge status: Home / Self Care | Payer: No Typology Code available for payment source | Attending: Emergency Medicine | Admitting: Emergency Medicine

## 2022-04-17 ENCOUNTER — Emergency Department (HOSPITAL_COMMUNITY): Payer: Medicaid Other

## 2022-04-17 ENCOUNTER — Other Ambulatory Visit: Payer: Self-pay

## 2022-04-17 DIAGNOSIS — Y92410 Unspecified street and highway as the place of occurrence of the external cause: Secondary | ICD-10-CM | POA: Insufficient documentation

## 2022-04-17 DIAGNOSIS — M542 Cervicalgia: Secondary | ICD-10-CM | POA: Diagnosis not present

## 2022-04-17 DIAGNOSIS — M25512 Pain in left shoulder: Secondary | ICD-10-CM | POA: Insufficient documentation

## 2022-04-17 DIAGNOSIS — R519 Headache, unspecified: Secondary | ICD-10-CM | POA: Diagnosis present

## 2022-04-17 MED ORDER — CYCLOBENZAPRINE HCL 10 MG PO TABS: 10.0000 mg | ORAL_TABLET | Freq: Two times a day (BID) | ORAL | 0 refills | Status: DC | PRN

## 2022-04-17 MED ORDER — OXYCODONE-ACETAMINOPHEN 5-325 MG PO TABS
1.0000 | ORAL_TABLET | Freq: Once | ORAL | Status: AC
  Administered 2022-04-17: 1 via ORAL
  Filled 2022-04-17: qty 1

## 2022-04-17 MED ORDER — IBUPROFEN 600 MG PO TABS: 600.0000 mg | ORAL_TABLET | Freq: Four times a day (QID) | ORAL | 0 refills | Status: DC | PRN
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
## 2022-04-17 NOTE — ED Notes (Signed)
Patient verbalizes understanding of discharge instructions. Opportunity for questioning and answers were provided. Armband removed by staff, pt discharged from ED. Pt ambulatory to ED waiting room with steady gait.  
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
## 2022-06-25 ENCOUNTER — Encounter (HOSPITAL_COMMUNITY): Payer: Self-pay

## 2022-06-25 ENCOUNTER — Ambulatory Visit (HOSPITAL_COMMUNITY)
Admission: RE | Admit: 2022-06-25 | Discharge: 2022-06-25 | Disposition: A | Discharge status: Home / Self Care | Payer: Medicaid Other | Source: Ambulatory Visit | Attending: Internal Medicine | Admitting: Internal Medicine

## 2022-06-25 VITALS — BP 141/97 | HR 66 | Temp 98.8°F | Resp 18

## 2022-06-25 DIAGNOSIS — Z202 Contact with and (suspected) exposure to infections with a predominantly sexual mode of transmission: Secondary | ICD-10-CM | POA: Insufficient documentation

## 2022-06-25 MED ORDER — CEFTRIAXONE SODIUM 500 MG IJ SOLR
INTRAMUSCULAR | Status: AC
  Filled 2022-06-25: qty 500

## 2022-06-25 MED ORDER — CEFTRIAXONE SODIUM 500 MG IJ SOLR
500.0000 mg | INTRAMUSCULAR | Status: DC
  Administered 2022-06-25: 500 mg via INTRAMUSCULAR

## 2022-06-25 MED ORDER — LIDOCAINE HCL (PF) 1 % IJ SOLN
INTRAMUSCULAR | Status: AC
  Filled 2022-06-25: qty 2

## 2022-06-25 NOTE — Discharge Instructions (Signed)
Your STD testing has been sent to the lab and will come back in the next 2 to 3 days.  We will call you if any of your results are positive requiring treatment and treat you at that time.   If you do not receive a phone call from [place], this means your testing was negative.  Avoid sexual intercourse until your STD results come back.  If any of your STD results are positive, you will need to avoid sexual intercourse for 7 days while you are being treated to prevent spread of STD.  Condom use is the best way to prevent spread of STDs.  Return to urgent care as needed.
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
## 2022-06-25 NOTE — ED Provider Notes (Signed)
[place]    CSN: [id number] Arrival date & time: 06/25/22  1345      History   Chief Complaint Chief Complaint  Patient presents with   Exposure to STD    Entered by patient   SEXUALLY TRANSMITTED DISEASE    HPI Alex Quinn is a 42 y.o. male.   Patient presents urgent care for evaluation of "tingling" to the penis that started a few days ago.  He states that he recently had sexual intercourse with a new male partner who tested positive for gonorrhea.  He did not know that the male had gonorrhea at the time of intercourse and he is not currently experiencing any penile discharge, however he does report dysuria.  Denies nausea, vomiting, abdominal pain, fever/chills, rash, urinary hesitancy, urinary urgency, and urinary frequency.  No recent known antibiotic use or known exposure to other STDs.  No history of HSV-2 and he is not having rash to the penis.   Exposure to STD    Past Medical History:  Diagnosis Date   Dislocated shoulder    Medical history non-contributory     There are no problems to display for this patient.   Past Surgical History:  Procedure Laterality Date   MANDIBLE FRACTURE SURGERY     ROTATOR CUFF REPAIR     manibulation   SHOULDER LATERJET Right 07/15/2017   Procedure: SHOULDER LATERJET;  Surgeon: Tania Ade, MD;  Location: [facility];  Service: Orthopedics;  Laterality: Right;       Home Medications    Prior to Admission medications   Medication Sig Start Date End Date Taking? Authorizing Provider  acetaminophen (TYLENOL) 500 MG tablet Take 2 tablets (1,000 mg total) by mouth every 8 (eight) hours as needed. 12/25/19   Darr, Edison Nasuti, PA-C  cyclobenzaprine (FLEXERIL) 10 MG tablet Take 1 tablet (10 mg total) by mouth 2 (two) times daily as needed for muscle spasms. 04/17/22   Domenic Moras, PA-C  diclofenac Sodium (VOLTAREN) 1 % GEL Apply 4 g topically 4 (four) times daily. 12/25/19   Darr, Edison Nasuti, PA-C  ibuprofen (ADVIL) 600  MG tablet Take 1 tablet (600 mg total) by mouth every 6 (six) hours as needed. 04/17/22   Domenic Moras, PA-C    Family History Family History  Family history unknown: Yes    Social History Social History   Tobacco Use   Smoking status: Former    Types: Cigarettes    Quit date: 06/30/2016    Years since quitting: 5.9   Smokeless tobacco: Never  Vaping Use   Vaping Use: Never used  Substance Use Topics   Alcohol use: No   Drug use: No     Allergies   Shrimp [shellfish allergy]   Review of Systems Review of Systems Per HPI  Physical Exam Triage Vital Signs ED Triage Vitals  Enc Vitals Group     BP 06/25/22 1420 (!) 141/97     Pulse Rate 06/25/22 1420 66     Resp 06/25/22 1420 18     Temp 06/25/22 1420 98.8 F (37.1 C)     Temp Source 06/25/22 1420 Oral     SpO2 06/25/22 1420 95 %     Weight --      Height --      Head Circumference --      Peak Flow --      Pain Score 06/25/22 1419 0     Pain Loc --      Pain Edu? --  Excl. in [place]? --    No data found.  Updated Vital Signs BP (!) 141/97 (BP Location: Left Arm)   Pulse 66   Temp 98.8 F (37.1 C) (Oral)   Resp 18   SpO2 95%   Visual Acuity Right Eye Distance:   Left Eye Distance:   Bilateral Distance:    Right Eye Near:   Left Eye Near:    Bilateral Near:     Physical Exam Vitals and nursing note reviewed.  Constitutional:      Appearance: He is not ill-appearing or toxic-appearing.  HENT:     Head: Normocephalic and atraumatic.     Right Ear: Hearing and external ear normal.     Left Ear: Hearing and external ear normal.     Nose: Nose normal.     Mouth/Throat:     Lips: Pink.  Eyes:     General: Lids are normal. Vision grossly intact. Gaze aligned appropriately.     Extraocular Movements: Extraocular movements intact.     Conjunctiva/sclera: Conjunctivae normal.  Pulmonary:     Effort: Pulmonary effort is normal.  Musculoskeletal:     Cervical back: Neck supple.  Skin:     General: Skin is warm and dry.     Capillary Refill: Capillary refill takes less than 2 seconds.     Findings: No rash.  Neurological:     General: No focal deficit present.     Mental Status: He is alert and oriented to person, place, and time. Mental status is at baseline.     Cranial Nerves: No dysarthria or facial asymmetry.  Psychiatric:        Mood and Affect: Mood normal.        Speech: Speech normal.        Behavior: Behavior normal.        Thought Content: Thought content normal.        Judgment: Judgment normal.      UC Treatments / Results  Labs (all labs ordered are listed, but only abnormal results are displayed) Labs Reviewed  CYTOLOGY, (ORAL, ANAL, URETHRAL) ANCILLARY ONLY    EKG   Radiology No results found.  Procedures Procedures (including critical care time)  Medications Ordered in UC Medications  cefTRIAXone (ROCEPHIN) injection 500 mg (has no administration in time range)    Initial Impression / Assessment and Plan / UC Course  I have reviewed the triage vital signs and the nursing notes.  Pertinent labs & imaging results that were available during my care of the patient were reviewed by me and considered in my medical decision making (see chart for details).   1. Possible exposure to STD STI labs pending, patient declines HIV and/or syphilis testing today. Will go ahead and start treatment for gonorrhea based on symptoms and exposure. Rocephin injection 591m given in clinic. Will treat for all other positive results once STI labs result. Patient to abstain from sexual intercourse for 7 days while undergoing treatment. Education provided regarding safe sexual practices and patient encouraged to use protection to prevent spread of STIs.   Discussed physical exam and available lab work findings in clinic with patient.  Counseled patient regarding appropriate use of medications and potential side effects for all medications recommended or prescribed  today. Discussed red flag signs and symptoms of worsening condition,when to call the PCP office, return to urgent care, and when to seek higher level of care in the emergency department. Patient verbalizes understanding and agreement with plan. All  questions answered. Patient discharged in stable condition.    Final Clinical Impressions(s) / UC Diagnoses   Final diagnoses:  Possible exposure to STD     Discharge Instructions      Your STD testing has been sent to the lab and will come back in the next 2 to 3 days.  We will call you if any of your results are positive requiring treatment and treat you at that time.   If you do not receive a phone call from [place], this means your testing was negative.  Avoid sexual intercourse until your STD results come back.  If any of your STD results are positive, you will need to avoid sexual intercourse for 7 days while you are being treated to prevent spread of STD.  Condom use is the best way to prevent spread of STDs.  Return to urgent care as needed.    ED Prescriptions   None    PDMP not reviewed this encounter.   Talbot Grumbling, [place] 06/25/22 [phone]

## 2022-06-26 ENCOUNTER — Telehealth (HOSPITAL_COMMUNITY): Payer: Self-pay | Admitting: Emergency Medicine

## 2022-06-26 LAB — CYTOLOGY, (ORAL, ANAL, URETHRAL) ANCILLARY ONLY
Chlamydia: NEGATIVE
Comment: NEGATIVE
Comment: NEGATIVE
Comment: NORMAL
Neisseria Gonorrhea: NEGATIVE
Trichomonas: POSITIVE — AB

## 2022-06-26 MED ORDER — METRONIDAZOLE 500 MG PO TABS: 2000.0000 mg | ORAL_TABLET | Freq: Once | ORAL | 0 refills | Status: AC

## 2022-07-07 ENCOUNTER — Ambulatory Visit
Admission: RE | Admit: 2022-07-07 | Discharge: 2022-07-07 | Disposition: A | Discharge status: Home / Self Care | Payer: Medicaid Other | Source: Ambulatory Visit | Attending: Emergency Medicine | Admitting: Emergency Medicine

## 2022-07-07 VITALS — BP 135/77 | HR 85 | Temp 99.2°F | Resp 18

## 2022-07-07 DIAGNOSIS — R319 Hematuria, unspecified: Secondary | ICD-10-CM | POA: Diagnosis present

## 2022-07-07 DIAGNOSIS — Z113 Encounter for screening for infections with a predominantly sexual mode of transmission: Secondary | ICD-10-CM

## 2022-07-07 DIAGNOSIS — R369 Urethral discharge, unspecified: Secondary | ICD-10-CM | POA: Diagnosis present

## 2022-07-07 LAB — POCT URINALYSIS DIP (MANUAL ENTRY)
Bilirubin, UA: NEGATIVE
Blood, UA: NEGATIVE
Glucose, UA: NEGATIVE mg/dL
Leukocytes, UA: NEGATIVE
Nitrite, UA: NEGATIVE
Protein Ur, POC: 30 mg/dL — AB
Spec Grav, UA: 1.025 (ref 1.010–1.025)
Urobilinogen, UA: 2 E.U./dL — AB
pH, UA: 7 (ref 5.0–8.0)

## 2022-07-07 NOTE — Discharge Instructions (Addendum)
Your tests are pending.  If your test results are positive, we will call you.  You and your sexual partner(s) may require treatment at that time.  Do not have sexual activity for at least 7 days.    Follow up with your primary care provider if your symptoms are not improving.

## 2022-07-07 NOTE — ED Provider Notes (Addendum)
[facility]    CSN: [id number] Arrival date & time: 07/07/22  1341      History   Chief Complaint Chief Complaint  Patient presents with   SEXUALLY TRANSMITTED DISEASE    Symptoms still present from last week - Entered by patient    HPI Alex Quinn is a 42 y.o. male.  Patient presents with penile discharge today.  He reports hematuria a few days ago but none since.  He denies fever, rash, abdominal pain, flank pain, testicular pain, or other symptoms.  Patient was seen at [facility] urgent care on 06/25/2022; diagnosed with possible exposure to STD; positive for trichomonas. Treated with metronidazole on 06/26/2022; He states he took all of the medication as prescribed.   The history is provided by the patient and medical records.    Past Medical History:  Diagnosis Date   Dislocated shoulder    Medical history non-contributory     There are no problems to display for this patient.   Past Surgical History:  Procedure Laterality Date   MANDIBLE FRACTURE SURGERY     ROTATOR CUFF REPAIR     manibulation   SHOULDER LATERJET Right 07/15/2017   Procedure: SHOULDER LATERJET;  Surgeon: Tania Ade, MD;  Location: [place];  Service: Orthopedics;  Laterality: Right;       Home Medications    Prior to Admission medications   Medication Sig Start Date End Date Taking? Authorizing Provider  acetaminophen (TYLENOL) 500 MG tablet Take 2 tablets (1,000 mg total) by mouth every 8 (eight) hours as needed. 12/25/19   Darr, Edison Nasuti, PA-C  cyclobenzaprine (FLEXERIL) 10 MG tablet Take 1 tablet (10 mg total) by mouth 2 (two) times daily as needed for muscle spasms. 04/17/22   Domenic Moras, PA-C  diclofenac Sodium (VOLTAREN) 1 % GEL Apply 4 g topically 4 (four) times daily. 12/25/19   Darr, Edison Nasuti, PA-C  ibuprofen (ADVIL) 600 MG tablet Take 1 tablet (600 mg total) by mouth every 6 (six) hours as needed. 04/17/22   Domenic Moras, PA-C    Family History Family History  Family history  unknown: Yes    Social History Social History   Tobacco Use   Smoking status: Former    Types: Cigarettes    Quit date: 06/30/2016    Years since quitting: 6.0   Smokeless tobacco: Never  Vaping Use   Vaping Use: Never used  Substance Use Topics   Alcohol use: No   Drug use: No     Allergies   Shrimp [shellfish allergy]   Review of Systems Review of Systems  Constitutional:  Negative for chills and fever.  Gastrointestinal:  Negative for abdominal pain, diarrhea, nausea and vomiting.  Genitourinary:  Positive for hematuria and penile discharge. Negative for dysuria, flank pain and testicular pain.  Skin:  Negative for rash and wound.  All other systems reviewed and are negative.    Physical Exam Triage Vital Signs ED Triage Vitals  Enc Vitals Group     BP      Pulse      Resp      Temp      Temp src      SpO2      Weight      Height      Head Circumference      Peak Flow      Pain Score      Pain Loc      Pain Edu?      Excl.  in [place]?    No data found.  Updated Vital Signs BP 135/77   Pulse 85   Temp 99.2 F (37.3 C)   Resp 18   SpO2 96%   Visual Acuity Right Eye Distance:   Left Eye Distance:   Bilateral Distance:    Right Eye Near:   Left Eye Near:    Bilateral Near:     Physical Exam Vitals and nursing note reviewed.  Constitutional:      General: He is not in acute distress.    Appearance: Normal appearance. He is well-developed. He is not ill-appearing.  HENT:     Mouth/Throat:     Mouth: Mucous membranes are moist.  Cardiovascular:     Rate and Rhythm: Normal rate and regular rhythm.     Heart sounds: Normal heart sounds.  Pulmonary:     Effort: Pulmonary effort is normal. No respiratory distress.     Breath sounds: Normal breath sounds.  Abdominal:     General: Bowel sounds are normal.     Palpations: Abdomen is soft.     Tenderness: There is no abdominal tenderness. There is no right CVA tenderness, left CVA tenderness,  guarding or rebound.  Genitourinary:    Comments: Patient declines GU exam. Musculoskeletal:     Cervical back: Neck supple.  Skin:    General: Skin is warm and dry.  Neurological:     Mental Status: He is alert.  Psychiatric:        Mood and Affect: Mood normal.        Behavior: Behavior normal.      UC Treatments / Results  Labs (all labs ordered are listed, but only abnormal results are displayed) Labs Reviewed  POCT URINALYSIS DIP (MANUAL ENTRY) - Abnormal; Notable for the following components:      Result Value   Ketones, POC UA trace (5) (*)    Protein Ur, POC =30 (*)    Urobilinogen, UA 2.0 (*)    All other components within normal limits  CYTOLOGY, (ORAL, ANAL, URETHRAL) ANCILLARY ONLY     EKG   Radiology No results found.  Procedures Procedures (including critical care time)  Medications Ordered in UC Medications - No data to display  Initial Impression / Assessment and Plan / UC Course  I have reviewed the triage vital signs and the nursing notes.  Pertinent labs & imaging results that were available during my care of the patient were reviewed by me and considered in my medical decision making (see chart for details).    STD screening, penile discharge, hematuria.  Urine negative for blood today and no indication of infection.  Patient obtained urethral self swab for testing.  Discussed that we will call if test results are positive.  Discussed that he may require treatment at that time.  Discussed that sexual partner(s) may also require treatment.  Instructed patient to abstain from sexual activity for at least 7 days.  Instructed him to follow-up with his PCP if symptoms are not improving.  Patient agrees to plan of care.   Final Clinical Impressions(s) / UC Diagnoses   Final diagnoses:  Screening for STD (sexually transmitted disease)  Penile discharge  Hematuria, unspecified type     Discharge Instructions      Your tests are pending.  If  your test results are positive, we will call you.  You and your sexual partner(s) may require treatment at that time.  Do not have sexual activity for at least  7 days.    Follow up with your primary care provider if your symptoms are not improving.          ED Prescriptions   None    PDMP not reviewed this encounter.   Sharion Balloon, NP 07/07/22 1419    Sharion Balloon, NP 07/07/22 [phone]

## 2022-07-07 NOTE — ED Triage Notes (Signed)
Patient to Urgent Care for STD testing. States he has had some penile discharge this afternoon. Denies any pain but does report some irritation.   Reports being tested at [facility] urgent care. Test was positive for trich. Patient took flagyl as prescribed.

## 2022-07-08 LAB — CYTOLOGY, (ORAL, ANAL, URETHRAL) ANCILLARY ONLY
Chlamydia: NEGATIVE
Comment: NEGATIVE
Comment: NEGATIVE
Comment: NORMAL
Neisseria Gonorrhea: NEGATIVE
Trichomonas: NEGATIVE

## 2022-07-13 ENCOUNTER — Encounter (HOSPITAL_COMMUNITY): Payer: Self-pay

## 2022-07-13 ENCOUNTER — Ambulatory Visit (HOSPITAL_COMMUNITY)
Admission: RE | Admit: 2022-07-13 | Discharge: 2022-07-13 | Disposition: A | Discharge status: Home / Self Care | Payer: Medicaid Other | Source: Ambulatory Visit | Attending: Family Medicine | Admitting: Family Medicine

## 2022-07-13 VITALS — BP 127/86 | HR 63 | Temp 98.0°F | Resp 14

## 2022-07-13 DIAGNOSIS — R369 Urethral discharge, unspecified: Secondary | ICD-10-CM | POA: Diagnosis not present

## 2022-07-13 LAB — POCT URINALYSIS DIPSTICK, ED / UC
Bilirubin Urine: NEGATIVE
Glucose, UA: NEGATIVE mg/dL
Hgb urine dipstick: NEGATIVE
Ketones, ur: NEGATIVE mg/dL
Leukocytes,Ua: NEGATIVE
Nitrite: NEGATIVE
Protein, ur: NEGATIVE mg/dL
Specific Gravity, Urine: 1.02 (ref 1.005–1.030)
Urobilinogen, UA: 0.2 mg/dL (ref 0.0–1.0)
pH: 8.5 — ABNORMAL HIGH (ref 5.0–8.0)

## 2022-07-13 NOTE — ED Triage Notes (Signed)
Pt reports was treated for Trich recently but still having discharge. Denies pain or urinary problems

## 2022-07-13 NOTE — ED Provider Notes (Signed)
[place]    CSN: [id number] Arrival date & time: 07/13/22  1033      History   Chief Complaint Chief Complaint  Patient presents with   appt 26    HPI Alex Quinn is a 42 y.o. male.   He is here for continued penile d/c.  He was treated for trichomonas 06/25/22.  Took all the flagyl.  He has continued with penile d/c.  He was seen on 07/07/22, all tests negative, but still continues with d/c. He is requesting to be retested today.  No known new exposures.  No other symptoms.  He feels well.        Past Medical History:  Diagnosis Date   Dislocated shoulder    Medical history non-contributory     There are no problems to display for this patient.   Past Surgical History:  Procedure Laterality Date   MANDIBLE FRACTURE SURGERY     ROTATOR CUFF REPAIR     manibulation   SHOULDER LATERJET Right 07/15/2017   Procedure: SHOULDER LATERJET;  Surgeon: Tania Ade, MD;  Location: [place];  Service: Orthopedics;  Laterality: Right;       Home Medications    Prior to Admission medications   Medication Sig Start Date End Date Taking? Authorizing Provider  acetaminophen (TYLENOL) 500 MG tablet Take 2 tablets (1,000 mg total) by mouth every 8 (eight) hours as needed. 12/25/19   Darr, Edison Nasuti, PA-C  cyclobenzaprine (FLEXERIL) 10 MG tablet Take 1 tablet (10 mg total) by mouth 2 (two) times daily as needed for muscle spasms. 04/17/22   Domenic Moras, PA-C  diclofenac Sodium (VOLTAREN) 1 % GEL Apply 4 g topically 4 (four) times daily. 12/25/19   Darr, Edison Nasuti, PA-C  ibuprofen (ADVIL) 600 MG tablet Take 1 tablet (600 mg total) by mouth every 6 (six) hours as needed. 04/17/22   Domenic Moras, PA-C    Family History Family History  Family history unknown: Yes    Social History Social History   Tobacco Use   Smoking status: Former    Types: Cigarettes    Quit date: 06/30/2016    Years since quitting: 6.0   Smokeless tobacco: Never  Vaping Use   Vaping  Use: Never used  Substance Use Topics   Alcohol use: No   Drug use: No     Allergies   Shrimp [shellfish allergy]   Review of Systems Review of Systems  Constitutional: Negative.   HENT: Negative.    Respiratory: Negative.    Cardiovascular: Negative.   Gastrointestinal: Negative.   Genitourinary:  Positive for penile discharge.  Musculoskeletal: Negative.   Psychiatric/Behavioral: Negative.       Physical Exam Triage Vital Signs ED Triage Vitals  Enc Vitals Group     BP 07/13/22 1047 127/86     Pulse Rate 07/13/22 1047 63     Resp 07/13/22 1047 14     Temp 07/13/22 1047 98 F (36.7 C)     Temp Source 07/13/22 1047 Oral     SpO2 07/13/22 1047 98 %     Weight --      Height --      Head Circumference --      Peak Flow --      Pain Score 07/13/22 1046 0     Pain Loc --      Pain Edu? --      Excl. in [place]? --    No data found.  Updated Vital Signs BP  127/86 (BP Location: Right Arm)   Pulse 63   Temp 98 F (36.7 C) (Oral)   Resp 14   SpO2 98%   Visual Acuity Right Eye Distance:   Left Eye Distance:   Bilateral Distance:    Right Eye Near:   Left Eye Near:    Bilateral Near:     Physical Exam Constitutional:      Appearance: Normal appearance.  Cardiovascular:     Rate and Rhythm: Normal rate.  Pulmonary:     Effort: Pulmonary effort is normal.  Skin:    General: Skin is warm.  Neurological:     General: No focal deficit present.     Mental Status: He is alert.  Psychiatric:        Mood and Affect: Mood normal.      UC Treatments / Results  Labs (all labs ordered are listed, but only abnormal results are displayed) Labs Reviewed  POCT URINALYSIS DIPSTICK, ED / UC  CYTOLOGY, (ORAL, ANAL, URETHRAL) ANCILLARY ONLY    EKG   Radiology No results found.  Procedures Procedures (including critical care time)  Medications Ordered in UC Medications - No data to display  Initial Impression / Assessment and Plan / UC Course  I have  reviewed the triage vital signs and the nursing notes.  Pertinent labs & imaging results that were available during my care of the patient were reviewed by me and considered in my medical decision making (see chart for details).    Final Clinical Impressions(s) / UC Diagnoses   Final diagnoses:  Penile discharge     Discharge Instructions      You were seen today for penile discharge.  Your swab will be resulted tomorrow and you will be notified if any positive results.  If your swab is negative an you continue with symptoms then please call [facility] at [phone].     ED Prescriptions   None    PDMP not reviewed this encounter.   Rondel Oh, MD 07/13/22 1112

## 2022-07-13 NOTE — Discharge Instructions (Addendum)
You were seen today for penile discharge.  Your swab will be resulted tomorrow and you will be notified if any positive results.  If your swab is negative an you continue with symptoms then please call [facility] at [phone].

## 2022-07-14 LAB — CYTOLOGY, (ORAL, ANAL, URETHRAL) ANCILLARY ONLY
Chlamydia: NEGATIVE
Comment: NEGATIVE
Comment: NEGATIVE
Comment: NORMAL
Neisseria Gonorrhea: NEGATIVE
Trichomonas: NEGATIVE

## 2022-07-20 ENCOUNTER — Ambulatory Visit (HOSPITAL_COMMUNITY)
Admission: RE | Admit: 2022-07-20 | Discharge: 2022-07-20 | Disposition: A | Discharge status: Home / Self Care | Payer: Commercial Managed Care - HMO | Source: Ambulatory Visit | Attending: Emergency Medicine | Admitting: Emergency Medicine

## 2022-07-20 ENCOUNTER — Encounter (HOSPITAL_COMMUNITY): Payer: Self-pay

## 2022-07-20 VITALS — BP 137/88 | HR 84 | Temp 98.4°F | Resp 14

## 2022-07-20 DIAGNOSIS — R369 Urethral discharge, unspecified: Secondary | ICD-10-CM | POA: Diagnosis present

## 2022-07-20 LAB — POCT URINALYSIS DIPSTICK, ED / UC
Bilirubin Urine: NEGATIVE
Glucose, UA: NEGATIVE mg/dL
Ketones, ur: NEGATIVE mg/dL
Leukocytes,Ua: NEGATIVE
Nitrite: NEGATIVE
Protein, ur: NEGATIVE mg/dL
Specific Gravity, Urine: >=1.03 (ref 1.005–1.030)
Urobilinogen, UA: 0.2 mg/dL (ref 0.0–1.0)
pH: 6 (ref 5.0–8.0)

## 2022-07-20 NOTE — ED Provider Notes (Signed)
[place]    CSN: [id number] Arrival date & time: 07/20/22  1028      History   Chief Complaint Chief Complaint  Patient presents with   appt 30    HPI Alex Quinn is a 42 y.o. male.  Here with an episode of penile discharge this morning. Also saw "something in his urine". No hematuria, dysuria, swelling, or pain.  Had trichomonas in February, treated with flagyl Since then having some discharge in the morning. He had negative cytology swabs on march 5th and 11th. Was given number for urology but hasn't called yet Requesting retesting today   Past Medical History:  Diagnosis Date   Dislocated shoulder    Medical history non-contributory     There are no problems to display for this patient.   Past Surgical History:  Procedure Laterality Date   MANDIBLE FRACTURE SURGERY     ROTATOR CUFF REPAIR     manibulation   SHOULDER LATERJET Right 07/15/2017   Procedure: SHOULDER LATERJET;  Surgeon: Tania Ade, MD;  Location: [facility];  Service: Orthopedics;  Laterality: Right;       Home Medications    Prior to Admission medications   Medication Sig Start Date End Date Taking? Authorizing Provider  acetaminophen (TYLENOL) 500 MG tablet Take 2 tablets (1,000 mg total) by mouth every 8 (eight) hours as needed. 12/25/19   Darr, Edison Nasuti, PA-C  ibuprofen (ADVIL) 600 MG tablet Take 1 tablet (600 mg total) by mouth every 6 (six) hours as needed. 04/17/22   Domenic Moras, PA-C    Family History Family History  Family history unknown: Yes    Social History Social History   Tobacco Use   Smoking status: Former    Types: Cigarettes    Quit date: 06/30/2016    Years since quitting: 6.0   Smokeless tobacco: Never  Vaping Use   Vaping Use: Never used  Substance Use Topics   Alcohol use: No   Drug use: No     Allergies   Shrimp [shellfish allergy]   Review of Systems Review of Systems As per HPI  Physical Exam Triage Vital Signs ED Triage  Vitals  Enc Vitals Group     BP 07/20/22 1043 137/88     Pulse Rate 07/20/22 1043 84     Resp 07/20/22 1043 14     Temp 07/20/22 1043 98.4 F (36.9 C)     Temp Source 07/20/22 1043 Oral     SpO2 07/20/22 1043 98 %     Weight --      Height --      Head Circumference --      Peak Flow --      Pain Score 07/20/22 1042 0     Pain Loc --      Pain Edu? --      Excl. in [place]? --    No data found.  Updated Vital Signs BP 137/88 (BP Location: Right Arm)   Pulse 84   Temp 98.4 F (36.9 C) (Oral)   Resp 14   SpO2 98%   Physical Exam Vitals and nursing note reviewed.  Constitutional:      General: He is not in acute distress.    Appearance: Normal appearance.  HENT:     Mouth/Throat:     Pharynx: Oropharynx is clear.  Cardiovascular:     Rate and Rhythm: Normal rate and regular rhythm.     Pulses: Normal pulses.  Pulmonary:  Effort: Pulmonary effort is normal.  Neurological:     Mental Status: He is alert and oriented to person, place, and time.     UC Treatments / Results  Labs (all labs ordered are listed, but only abnormal results are displayed) Labs Reviewed  POCT URINALYSIS DIPSTICK, ED / UC - Abnormal; Notable for the following components:      Result Value   Hgb urine dipstick TRACE (*)    All other components within normal limits  CYTOLOGY, (ORAL, ANAL, URETHRAL) ANCILLARY ONLY    EKG   Radiology No results found.  Procedures Procedures (including critical care time)  Medications Ordered in UC Medications - No data to display  Initial Impression / Assessment and Plan / UC Course  I have reviewed the triage vital signs and the nursing notes.  Pertinent labs & imaging results that were available during my care of the patient were reviewed by me and considered in my medical decision making (see chart for details).  UA trace hgb, elevated specific gravity. Advised increase fluids Cytology swab is pending again. Discussed he needs to follow with  urology for further eval. Provided with 2 clinics. He also requests number for dermatology  Final Clinical Impressions(s) / UC Diagnoses   Final diagnoses:  Penile discharge     Discharge Instructions      Please call the urology clinic to schedule appointment. I recommend to take the first available appointment. They will be able to provider further testing and evaluation.      ED Prescriptions   None    PDMP not reviewed this encounter.   Zollie Clemence, [place], [place] 07/20/22 1206

## 2022-07-20 NOTE — ED Triage Notes (Signed)
Pt reports was positive for trich months ago and was treated. Reports that had been negative for STD twice since and were all negative.  Pt reports that had "what looked like pre-cum before I urinated this morning. Reports notice something in urine sometimes as well". Wants testing to make sure all negative. Pt requesting list of urology and dermatology.

## 2022-07-20 NOTE — Discharge Instructions (Signed)
Please call the urology clinic to schedule appointment. I recommend to take the first available appointment. They will be able to provider further testing and evaluation.

## 2022-07-21 LAB — CYTOLOGY, (ORAL, ANAL, URETHRAL) ANCILLARY ONLY
Chlamydia: NEGATIVE
Comment: NEGATIVE
Comment: NEGATIVE
Comment: NORMAL
Neisseria Gonorrhea: NEGATIVE
Trichomonas: NEGATIVE

## 2022-08-06 ENCOUNTER — Ambulatory Visit: Payer: Medicaid Other | Admitting: Urology

## 2022-08-06 ENCOUNTER — Encounter: Payer: Self-pay | Admitting: Urology

## 2022-08-06 VITALS — BP 151/86 | HR 63 | Ht 72.0 in | Wt 208.0 lb

## 2022-08-06 DIAGNOSIS — R369 Urethral discharge, unspecified: Secondary | ICD-10-CM | POA: Diagnosis not present

## 2022-08-06 LAB — MICROSCOPIC EXAMINATION
Bacteria, UA: NONE SEEN
Cast Type: NONE SEEN
Casts: NONE SEEN /lpf
Crystal Type: NONE SEEN
Crystals: NONE SEEN
Epithelial Cells (non renal): NONE SEEN /hpf (ref 0–10)
Renal Epithel, UA: NONE SEEN /hpf
Trichomonas, UA: NONE SEEN
Yeast, UA: NONE SEEN

## 2022-08-06 LAB — URINALYSIS, ROUTINE W REFLEX MICROSCOPIC
Bilirubin, UA: NEGATIVE
Glucose, UA: NEGATIVE
Ketones, UA: NEGATIVE
Leukocytes,UA: NEGATIVE
Nitrite, UA: NEGATIVE
Specific Gravity, UA: 1.03 (ref 1.005–1.030)
Urobilinogen, Ur: 1 mg/dL (ref 0.2–1.0)
pH, UA: 7 (ref 5.0–7.5)

## 2022-08-06 MED ORDER — DOXYCYCLINE HYCLATE 100 MG PO CAPS: 100.0000 mg | ORAL_CAPSULE | Freq: Two times a day (BID) | ORAL | 0 refills | Status: AC

## 2022-08-06 MED ORDER — MOXIFLOXACIN HCL 400 MG PO TABS: 400.0000 mg | ORAL_TABLET | Freq: Every day | ORAL | 0 refills | Status: AC

## 2022-08-06 NOTE — Progress Notes (Signed)
Assessment: 1. Urethral discharge in male     Plan: I personally reviewed the patient's chart including provider notes, lab results. Given his multiple negative test for GC and chlamydia, I think it be reasonable to treat him for possible mycoplasma genitalium. Doxycycline 100 mg BID x 7 days and Moxifoxacin 400 mg daily x 7 days Return to office in 2 weeks.  If his discharge has not resolved, need to consider further testing  Chief Complaint:  Chief Complaint  Patient presents with   urethral discharge    History of Present Illness:  Alex Quinn "Prophet"  is a 42 y.o. male who is seen for evaluation of urethral discharge. He presented to urgent care in February 2024 with symptoms of penile discomfort following unprotected sexual intercourse.  His sexual partner was diagnosed with gonorrhea.  He tested positive for trichomonas and was treated with Flagyl.  Gonorrhea and Chlamydia tests were negative.  He subsequently developed urethral discharge.  Follow-up testing for STDs has been negative. He has continued to notice a clear urethral discharge.  He also has noted some material floating in his urine.  He is not having any dysuria.  He voids with a good stream.  No gross hematuria.  No scrotal pain.  No new lesions in the genital area.  No pain or swelling in the inguinal area.   Past Medical History:  Past Medical History:  Diagnosis Date   Dislocated shoulder    Medical history non-contributory     Past Surgical History:  Past Surgical History:  Procedure Laterality Date   MANDIBLE FRACTURE SURGERY     ROTATOR CUFF REPAIR     manibulation   SHOULDER LATERJET Right 07/15/2017   Procedure: SHOULDER LATERJET;  Surgeon: Tania Ade, MD;  Location: [facility];  Service: Orthopedics;  Laterality: Right;    Allergies:  Allergies  Allergen Reactions   Shrimp [Shellfish Allergy] Swelling    Family History:  Family History  Family history unknown: Yes    Social  History:  Social History   Tobacco Use   Smoking status: Former    Types: Cigarettes    Quit date: 06/30/2016    Years since quitting: 6.1   Smokeless tobacco: Never  Vaping Use   Vaping Use: Never used  Substance Use Topics   Alcohol use: No   Drug use: No    Review of symptoms:  Constitutional:  Negative for unexplained weight loss, night sweats, fever, chills ENT:  Negative for nose bleeds, sinus pain, painful swallowing CV:  Negative for chest pain, shortness of breath, exercise intolerance, palpitations, loss of consciousness Resp:  Negative for cough, wheezing, shortness of breath GI:  Negative for nausea, vomiting, diarrhea, bloody stools GU:  Positives noted in HPI; otherwise negative for gross hematuria, dysuria, urinary incontinence Neuro:  Negative for seizures, poor balance, limb weakness, slurred speech Psych:  Negative for lack of energy, depression, anxiety Endocrine:  Negative for polydipsia, polyuria, symptoms of hypoglycemia (dizziness, hunger, sweating) Hematologic:  Negative for anemia, purpura, petechia, prolonged or excessive bleeding, use of anticoagulants  Allergic:  Negative for difficulty breathing or choking as a result of exposure to anything; no shellfish allergy; no allergic response (rash/itch) to materials, foods  Physical exam: BP (!) 151/86   Pulse 63   Ht 6' (1.829 m)   Wt 208 lb (94.3 kg)   BMI 28.21 kg/m  GENERAL APPEARANCE:  Well appearing, well developed, well nourished, NAD HEENT:  Atraumatic, normocephalic, oropharynx clear NECK:  Supple  without lymphadenopathy or thyromegaly ABDOMEN:  Soft, non-tender, no masses EXTREMITIES:  Moves all extremities well, without clubbing, cyanosis, or edema NEUROLOGIC:  Alert and oriented x 3, normal gait, CN II-XII grossly intact MENTAL STATUS:  appropriate BACK:  Non-tender to palpation, No CVAT SKIN:  Warm, dry, and intact GU: Penis:  circumcised; 2 flesh colored lesions on penile shaft; no  other lesions noted Meatus: Normal Scrotum: normal, no masses Testis: normal without masses bilateral Epididymis: normal   Results: U/A:  11-30 WBC, 0-2 RBC, no bacteria

## 2022-08-24 ENCOUNTER — Ambulatory Visit: Payer: Medicaid Other | Admitting: Urology

## 2022-08-24 ENCOUNTER — Encounter: Payer: Self-pay | Admitting: Urology

## 2022-08-24 VITALS — BP 141/95 | HR 64 | Ht 72.0 in | Wt 208.0 lb

## 2022-08-24 DIAGNOSIS — R369 Urethral discharge, unspecified: Secondary | ICD-10-CM | POA: Diagnosis not present

## 2022-08-24 DIAGNOSIS — Z09 Encounter for follow-up examination after completed treatment for conditions other than malignant neoplasm: Secondary | ICD-10-CM

## 2022-08-24 LAB — URINALYSIS, ROUTINE W REFLEX MICROSCOPIC
Bilirubin, UA: NEGATIVE
Glucose, UA: NEGATIVE
Ketones, UA: NEGATIVE
Leukocytes,UA: NEGATIVE
Nitrite, UA: NEGATIVE
Protein,UA: NEGATIVE
Specific Gravity, UA: 1.025 (ref 1.005–1.030)
Urobilinogen, Ur: 0.2 mg/dL (ref 0.2–1.0)
pH, UA: 6.5 (ref 5.0–7.5)

## 2022-08-24 LAB — MICROSCOPIC EXAMINATION
Cast Type: NONE SEEN
Casts: NONE SEEN /lpf
Crystal Type: NONE SEEN
Crystals: NONE SEEN
Epithelial Cells (non renal): NONE SEEN /hpf (ref 0–10)
Mucus, UA: NONE SEEN
Renal Epithel, UA: NONE SEEN /hpf
Trichomonas, UA: NONE SEEN
WBC, UA: NONE SEEN /hpf (ref 0–5)
Yeast, UA: NONE SEEN

## 2022-08-24 NOTE — Progress Notes (Signed)
   Assessment: 1. Urethral discharge in male     Plan: His symptoms have resolved. Return to office as needed.  Chief Complaint:  Chief Complaint  Patient presents with   Follow-up    Urethral discharge    History of Present Illness:  Alex Quinn "Prophet"  is a 42 y.o. male who is seen for continued evaluation of urethral discharge. He presented to urgent care in February 2024 with symptoms of penile discomfort following unprotected sexual intercourse.  His sexual partner was diagnosed with gonorrhea.  He tested positive for trichomonas and was treated with Flagyl.  Gonorrhea and Chlamydia tests were negative.  He subsequently developed urethral discharge.  Follow-up testing for STDs was negative. He continued to notice a clear urethral discharge.  He also noted some material floating in his urine.  He was not having any dysuria.  He voids with a good stream.  No gross hematuria.  No scrotal pain.  No new lesions in the genital area.  No pain or swelling in the inguinal area. He was treated with doxycycline x 7 days and moxifloxacin x 7 days.  He returns today for follow-up.  He has completed his antibiotics.  He is not having any urinary symptoms.  His urethral discharge has resolved. IPSS = 5 today.  Portions of the above documentation were copied from a prior visit for review purposes only.  Past Medical History:  Past Medical History:  Diagnosis Date   Dislocated shoulder    Medical history non-contributory     Past Surgical History:  Past Surgical History:  Procedure Laterality Date   MANDIBLE FRACTURE SURGERY     ROTATOR CUFF REPAIR     manibulation   SHOULDER LATERJET Right 07/15/2017   Procedure: SHOULDER LATERJET;  Surgeon: Jones Broom, MD;  Location: [facility];  Service: Orthopedics;  Laterality: Right;    Allergies:  Allergies  Allergen Reactions   Shrimp [Shellfish Allergy] Swelling    Family History:  Family History  Family history unknown: Yes     Social History:  Social History   Tobacco Use   Smoking status: Former    Types: Cigarettes    Quit date: 06/30/2016    Years since quitting: 6.1   Smokeless tobacco: Never  Vaping Use   Vaping Use: Never used  Substance Use Topics   Alcohol use: No   Drug use: No    ROS: Constitutional:  Negative for fever, chills, weight loss CV: Negative for chest pain, previous MI, hypertension Respiratory:  Negative for shortness of breath, wheezing, sleep apnea, frequent cough GI:  Negative for nausea, vomiting, bloody stool, GERD  Physical exam: BP (!) 151/100   Pulse 74   Ht 6' (1.829 m)   Wt 208 lb (94.3 kg)   BMI 28.21 kg/m  GENERAL APPEARANCE:  Well appearing, well developed, well nourished, NAD HEENT:  Atraumatic, normocephalic, oropharynx clear NECK:  Supple without lymphadenopathy or thyromegaly ABDOMEN:  Soft, non-tender, no masses EXTREMITIES:  Moves all extremities well, without clubbing, cyanosis, or edema NEUROLOGIC:  Alert and oriented x 3, normal gait, CN II-XII grossly intact MENTAL STATUS:  appropriate BACK:  Non-tender to palpation, No CVAT SKIN:  Warm, dry, and intact   Results: U/A:  0-2 RBC, few bacteria

## 2022-08-27 ENCOUNTER — Telehealth: Payer: Self-pay | Admitting: Urology

## 2022-08-27 NOTE — Telephone Encounter (Signed)
Pt viewed lab results on his urinalysis from Monday 08/24/22 and was concerned about the few bacteria that resulted as abnormal. Advised pt his urine did not show any WBC's or any other findings. Pt expressed understanding.

## 2022-08-27 NOTE — Telephone Encounter (Signed)
Patient called in regards to some test results he received and he has some concerns about that, he would like a call back at [phone].

## 2022-10-06 ENCOUNTER — Encounter: Payer: Self-pay | Admitting: Physical Medicine & Rehabilitation

## 2022-10-07 ENCOUNTER — Encounter: Payer: No Typology Code available for payment source | Admitting: Physical Medicine & Rehabilitation

## 2022-11-04 ENCOUNTER — Encounter
Payer: Worker's Compensation | Attending: Physical Medicine & Rehabilitation | Admitting: Physical Medicine & Rehabilitation

## 2022-11-04 ENCOUNTER — Encounter: Payer: Self-pay | Admitting: Physical Medicine & Rehabilitation

## 2022-11-04 VITALS — BP 119/79 | HR 63 | Ht 72.0 in | Wt 204.0 lb

## 2022-11-04 DIAGNOSIS — G44321 Chronic post-traumatic headache, intractable: Secondary | ICD-10-CM | POA: Insufficient documentation

## 2022-11-04 DIAGNOSIS — F0781 Postconcussional syndrome: Secondary | ICD-10-CM | POA: Diagnosis present

## 2022-11-04 DIAGNOSIS — G44309 Post-traumatic headache, unspecified, not intractable: Secondary | ICD-10-CM | POA: Insufficient documentation

## 2022-11-04 DIAGNOSIS — H8110 Benign paroxysmal vertigo, unspecified ear: Secondary | ICD-10-CM | POA: Diagnosis present

## 2022-11-04 DIAGNOSIS — R4586 Emotional lability: Secondary | ICD-10-CM | POA: Diagnosis present

## 2022-11-04 MED ORDER — TOPIRAMATE 25 MG PO TABS: 25.0000 mg | ORAL_TABLET | Freq: Every day | ORAL | 2 refills | Status: DC

## 2022-11-04 NOTE — Patient Instructions (Addendum)
ALWAYS FEEL FREE TO CALL OUR OFFICE WITH ANY PROBLEMS OR QUESTIONS [phone])  **PLEASE NOTE** ALL MEDICATION REFILL REQUESTS (INCLUDING CONTROLLED SUBSTANCES) NEED TO BE MADE AT LEAST 7 DAYS PRIOR TO REFILL BEING DUE. ANY REFILL REQUESTS INSIDE THAT TIME FRAME MAY RESULT IN DELAYS IN RECEIVING YOUR PRESCRIPTION.    TOPAMAX (HEADACHE MEDICINE)  TAKE ONE AT BEDTIME (25MG ) FOR ONE WEEK. IF HEADACHES IMPROVE, YOU CAN STAY AT THIS DOSE. IF THE HEADACHES DON'T IMPROVE, INCREASE TO 50MG  (2 PILLS).
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
## 2023-01-06 ENCOUNTER — Encounter: Payer: Self-pay | Admitting: Physical Medicine & Rehabilitation

## 2023-01-06 ENCOUNTER — Encounter
Payer: Worker's Compensation | Attending: Physical Medicine & Rehabilitation | Admitting: Physical Medicine & Rehabilitation

## 2023-01-06 VITALS — BP 125/88 | HR 67 | Ht 72.0 in | Wt 203.0 lb

## 2023-01-06 DIAGNOSIS — H8110 Benign paroxysmal vertigo, unspecified ear: Secondary | ICD-10-CM | POA: Diagnosis present

## 2023-01-06 DIAGNOSIS — G44321 Chronic post-traumatic headache, intractable: Secondary | ICD-10-CM | POA: Diagnosis present

## 2023-01-06 DIAGNOSIS — F0781 Postconcussional syndrome: Secondary | ICD-10-CM | POA: Diagnosis present

## 2023-01-06 NOTE — Patient Instructions (Signed)
FOR HEADACHES:  TAKE TOPAMAX 25MG  AT BEDTIME FOR ONE WEEK.   IF NO IMPROVEMENT AFTER ONE WEEK, INCREASE TO 2 TOPAMAX (50MG )--TAKE AT BEDTIME   YOU CAN CONTINUE USING IBUPROFEN FOR BREAKTHROUGH HEADACHES DURING THE DAY.

## 2023-01-06 NOTE — Progress Notes (Signed)
Subjective:    Patient ID: Alex Quinn, male    DOB: Dec 11, 1980, 42 y.o.   MRN: [id number]  HPI  Alex Quinn is here in follow up of his post concussion syndrome.   His headaches have been persistent. However, he's not been taking topamax on a scheduled basis. He has used it when his headaches are more severe >6/10. Ibuprofen does help some during the day which he tolerates better.   He is still waiting on an appt to [facility]. He says WC hasn't approved visit yet. His balance is still an issue and he's limited with his mobility as a result.  Low back pain remains also. He had been seeing PT for a while which did provide benefit. I'm not sure if he is due more or not.     Pain Inventory Average Pain 7 Pain Right Now 3 My pain is sharp, burning, stabbing, and aching  In the last 24 hours, has pain interfered with the following? General activity 5 Relation with others 5 Enjoyment of life 5 What TIME of day is your pain at its worst? morning , daytime, evening, and night Sleep (in general) Fair  Pain is worse with: bending, standing, and some activites Pain improves with: rest, heat/ice, therapy/exercise, and medication Relief from Meds: 5  Family History  Family history unknown: Yes   Social History   Socioeconomic History   Marital status: Single    Spouse name: Not on file   Number of children: Not on file   Years of education: Not on file   Highest education level: Not on file  Occupational History   Not on file  Tobacco Use   Smoking status: Former    Current packs/day: 0.00    Types: Cigarettes    Quit date: 06/30/2016    Years since quitting: 6.5   Smokeless tobacco: Never  Vaping Use   Vaping status: Never Used  Substance and Sexual Activity   Alcohol use: No   Drug use: No   Sexual activity: Yes    Birth control/protection: None  Other Topics Concern   Not on file  Social History Narrative   Not on file   Social Determinants of Health    Financial Resource Strain: Not on file  Food Insecurity: Not on file  Transportation Needs: Not on file  Physical Activity: Not on file  Stress: Not on file  Social Connections: Not on file   Past Surgical History:  Procedure Laterality Date   MANDIBLE FRACTURE SURGERY     ROTATOR CUFF REPAIR     manibulation   SHOULDER LATERJET Right 07/15/2017   Procedure: SHOULDER LATERJET;  Surgeon: Jones Broom, MD;  Location: [facility];  Service: Orthopedics;  Laterality: Right;   Past Surgical History:  Procedure Laterality Date   MANDIBLE FRACTURE SURGERY     ROTATOR CUFF REPAIR     manibulation   SHOULDER LATERJET Right 07/15/2017   Procedure: SHOULDER LATERJET;  Surgeon: Jones Broom, MD;  Location: [facility] OR;  Service: Orthopedics;  Laterality: Right;   Past Medical History:  Diagnosis Date   Dislocated shoulder    Medical history non-contributory    Ht 6' (1.829 m)   Wt 203 lb (92.1 kg)   BMI 27.53 kg/m   Opioid Risk Score:   Fall Risk Score:  `1  Depression screen [facility] 2/9     01/06/2023   10:26 AM 11/04/2022   10:52 AM  Depression screen [facility] 2/9  Decreased Interest 0 2  Down, Depressed, Hopeless 0 0  PHQ - 2 Score 0 2  Altered sleeping  1  Tired, decreased energy  1  Change in appetite  0  Feeling bad or failure about yourself   1  Trouble concentrating  1  Moving slowly or fidgety/restless  0  Suicidal thoughts  0  PHQ-9 Score  6  Difficult doing work/chores  Somewhat difficult     Review of Systems  Musculoskeletal:  Positive for back pain.  Neurological:  Positive for headaches.      Objective:   Physical Exam General: No acute distress HEENT: NCAT, EOMI, oral membranes moist Cards: reg rate  Chest: normal effort Abdomen: Soft, NT, ND Skin: dry, intact Extremities: no edema Psych: pleasant and appropriate, sl labile Skin: intact Neuro:  Alert and oriented x 3. Normal insight and awareness. Still distracted at times with limted attention.  Intact  Memory. Normal language and speech. Cranial nerve exam unremarkable. MMT: 5/5. Did not perform vestibular testing today.  Musculoskeletal: Full ROM, No pain with AROM or PROM in the neck, trunk, or extremities. Posture appropriate            ASSESSMENT  Post concussion syndrome with ongoing headaches, oculo-vestibular dysfunction, ongoing attention deficits and irritability     PLAN:  Discussed topamax use. He will begin at bedtime for a week. .  May use tylenol or ibuprofen prn for breakthrough headaches.  Referral to [facility] for oculo-vestibular assessment. He has ongoing visual phenomena related to his injury which tie into his dizziness and loss of equilibrium.  Need contact with worker's comp.   Cervical and lumbar injuries per ortho     30 minutes of face to face patient care time were spent during this visit. All questions were encouraged and answered. Follow up with me in 2 mos. Marland Kitchen

## 2023-01-07 NOTE — Addendum Note (Signed)
Addended by: Faith Rogue T on: 01/07/2023 10:57 AM   Modules accepted: Orders

## 2023-01-19 ENCOUNTER — Telehealth: Payer: Self-pay | Admitting: *Deleted

## 2023-01-19 NOTE — Telephone Encounter (Signed)
Tabitha calling from [facility]. 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 Eval and treat.  They are not accepting new patient's at this time. Is there additional request as to where patient could be seen?

## 2023-01-27 ENCOUNTER — Telehealth: Payer: Self-pay | Admitting: *Deleted

## 2023-01-27 NOTE — Telephone Encounter (Signed)
Tabitha from [employer]  called and the referral to [facility] has a problem that they are not taking new patients. She has called a few places and cannot find anyone who does this assessment. Do you have any other places to refer to?  Please call her and let her know [phone].

## 2023-01-29 NOTE — Telephone Encounter (Signed)
I left a message on Tabitha's VM and let her know Dr Riley Kill response.

## 2023-03-24 ENCOUNTER — Encounter: Payer: Medicaid Other | Attending: Physical Medicine & Rehabilitation | Admitting: Physical Medicine & Rehabilitation

## 2023-07-26 ENCOUNTER — Emergency Department

## 2023-07-26 ENCOUNTER — Emergency Department
Admission: EM | Admit: 2023-07-26 | Discharge: 2023-07-26 | Discharge status: Against Medical Advice | Attending: Emergency Medicine | Admitting: Emergency Medicine

## 2023-07-26 DIAGNOSIS — L02415 Cutaneous abscess of right lower limb: Secondary | ICD-10-CM | POA: Diagnosis present

## 2023-07-26 DIAGNOSIS — Z5321 Procedure and treatment not carried out due to patient leaving prior to being seen by health care provider: Secondary | ICD-10-CM | POA: Insufficient documentation

## 2023-07-26 NOTE — ED Triage Notes (Signed)
 Pt presents to the ED pov from home for possible abscess on right knee. Pt states it has been there for about three weeks, but recently became painful.  Denies fevers or chills  Pt evaluated by Levada Schilling, PA at time of triage

## 2023-07-26 NOTE — ED Notes (Signed)
 No answer when called several times from lobby

## 2023-07-26 NOTE — ED Provider Triage Note (Signed)
 Emergency Medicine Provider Triage Evaluation Note  Alex Quinn , a 43 y.o. male  was evaluated in triage.  Pt complains of right knee with possible abscess.  Not aware of FB to the knee.  Area has been like this for approx 3 weeks.    Review of Systems  Positive: Tender to touch right knee Negative: No redness, no effusion  Physical Exam  BP (!) 156/84   Pulse 67   Temp 98.6 F (37 C) (Oral)   Resp 18   Ht 6' (1.829 m)   Wt 80.3 kg   SpO2 100%   BMI 24.01 kg/m  Gen:   Awake, no distress   Resp:  Normal effort  MSK:   Moves extremities without difficulty.  Ambulatory without assistance.   Other:  Anterior right knee with round, soft, tender nodule.  Localized over patella.   Medical Decision Making  Medically screening exam initiated at 1:29 PM.  Appropriate orders placed.  Alex Quinn was informed that the remainder of the evaluation will be completed by another provider, this initial triage assessment does not replace that evaluation, and the importance of remaining in the ED until their evaluation is complete.     Tommi Rumps, PA-C 07/26/23 1331

## 2023-07-27 ENCOUNTER — Other Ambulatory Visit: Payer: Self-pay

## 2023-07-27 ENCOUNTER — Encounter (HOSPITAL_COMMUNITY): Payer: Self-pay

## 2023-07-27 ENCOUNTER — Emergency Department (HOSPITAL_COMMUNITY)
Admission: EM | Admit: 2023-07-27 | Discharge: 2023-07-27 | Disposition: A | Discharge status: Home / Self Care | Attending: Emergency Medicine | Admitting: Emergency Medicine

## 2023-07-27 DIAGNOSIS — L02415 Cutaneous abscess of right lower limb: Secondary | ICD-10-CM | POA: Diagnosis present

## 2023-07-27 DIAGNOSIS — D369 Benign neoplasm, unspecified site: Secondary | ICD-10-CM | POA: Diagnosis not present

## 2023-07-27 MED ORDER — AMOXICILLIN-POT CLAVULANATE 875-125 MG PO TABS: 1.0000 | ORAL_TABLET | Freq: Two times a day (BID) | ORAL | 0 refills | Status: DC

## 2023-07-27 NOTE — ED Provider Notes (Signed)
 [place] EMERGENCY DEPARTMENT AT [facility] Provider Note   CSN: [id number] Arrival date & time: 07/27/23  5284     History  Chief Complaint  Patient presents with   Knee Pain   Abscess    Alex Quinn is a 43 y.o. male.   Knee Pain Abscess  Patient is a 43 year old male presents the ED today with concerns for abscess on his right knee that has been present for 3 weeks and has gotten worse over the last week, becoming more painful, not pruritic, no decrease in ROM.  Previous history of abscess, BPPV, postconcussion syndrome.  Reports that it has grown in size and has become more painful to the touch.  Able to ambulate without difficulty.  Denies fevers, chills, sore throat, vision changes, numbness, weakness, tingling, rashes, n/v/d, dysuria.     Home Medications Prior to Admission medications   Medication Sig Start Date End Date Taking? Authorizing Provider  amoxicillin-clavulanate (AUGMENTIN) 875-125 MG tablet Take 1 tablet by mouth every 12 (twelve) hours. 07/27/23  Yes Lunette Stands, PA-C      Allergies    Shrimp [shellfish allergy]    Review of Systems   Review of Systems  Skin:  Positive for rash.  All other systems reviewed and are negative.   Physical Exam Updated Vital Signs BP (!) 147/84 (BP Location: Right Arm)   Pulse 78   Temp 98.9 F (37.2 C) (Oral)   Resp 15   Ht 6' (1.829 m)   Wt 79.4 kg   SpO2 100%   BMI 23.73 kg/m  Physical Exam Vitals and nursing note reviewed.  Constitutional:      General: He is not in acute distress.    Appearance: Normal appearance. He is not ill-appearing.  HENT:     Head: Normocephalic and atraumatic.  Eyes:     General: No scleral icterus.       Right eye: No discharge.        Left eye: No discharge.     Extraocular Movements: Extraocular movements intact.     Conjunctiva/sclera: Conjunctivae normal.  Cardiovascular:     Rate and Rhythm: Normal rate and regular rhythm.     Pulses: Normal  pulses.     Heart sounds: Normal heart sounds. No murmur heard.    No friction rub. No gallop.  Pulmonary:     Effort: Pulmonary effort is normal. No respiratory distress.     Breath sounds: Normal breath sounds. No stridor. No wheezing, rhonchi or rales.  Abdominal:     General: Abdomen is flat. There is no distension.     Palpations: Abdomen is soft.     Tenderness: There is no abdominal tenderness. There is no right CVA tenderness, left CVA tenderness or guarding.  Musculoskeletal:        General: Tenderness (Tenderness noted over 2.5 x 2.5 cm raised lesion.) present. No swelling or signs of injury. Normal range of motion.     Right lower leg: No edema.     Left lower leg: No edema.  Skin:    General: Skin is warm and dry.     Coloration: Skin is not jaundiced or pale.     Findings: Lesion present. No bruising, erythema or rash.  Neurological:     General: No focal deficit present.     Mental Status: He is alert. Mental status is at baseline.     Sensory: No sensory deficit.     Motor: No weakness.  Psychiatric:        Mood and Affect: Mood normal.     ED Results / Procedures / Treatments   Labs (all labs ordered are listed, but only abnormal results are displayed) Labs Reviewed - No data to display  EKG None  Radiology DG Knee 2 Views Right Result Date: 07/26/2023 CLINICAL DATA:  Right knee abscess anteriorly, assess for foreign body EXAM: RIGHT KNEE - 1-2 VIEW COMPARISON:  None Available. FINDINGS: Normal alignment without acute osseous finding, fracture, or effusion. Preserved joint space. Round focal soft tissue swelling of the anterior knee soft tissues along the inferior patella margin and patella tendon insertion. No radiopaque foreign body. IMPRESSION: 1. Anterior knee soft tissue swelling. No radiopaque foreign body. 2. No acute osseous finding. Electronically Signed   By: Judie Petit.  Shick M.D.   On: 07/26/2023 15:11    Procedures .Ultrasound ED Soft  Tissue  Date/Time: 07/27/2023 7:23 AM  Performed by: Lunette Stands, PA-C Authorized by: Lunette Stands, PA-C   Procedure details:    Indications: limb pain, localization of abscess, evaluate for cellulitis and evaluate for foreign body     Transverse view:  Visualized   Longitudinal view:  Visualized   Images: not archived   Location:    Location: lower extremity     Side:  Right Findings:     no abscess present    no cellulitis present    no foreign body present Comments:     Dermoid cyst    Medications Ordered in ED Medications - No data to display  ED Course/ Medical Decision Making/ A&P                               Medical Decision Making   Patient is a 43 year old male presents the ED today with concerns for abscess on his right knee that has been present for 3 weeks and has gotten worse over the last week, becoming more painful, not pruritic, no decrease in ROM.  Previous history of abscess, BPPV, postconcussion syndrome.  Reports that it has grown in size and has become more painful to the touch.  Able to ambulate without difficulty.  On physical exam, patient is noted to have a cystlike structure on the right patella that is tender to palpation.  It is not erythematous, not warm to the touch.  Patient has full range of motion of knee.  Able to ambulate that difficulty.  POC ultrasound was done and showed to be a dermoid cyst and not an abscess or cellulitis.  Will refer him to general surgery to have cyst removed in a nonemergent setting.  Will prescribe antibiotics to prevent any further inflammation and decreased pain.  Will have him also take symptomatic management at home.  Low suspicion for any emergent pathology present this time.  I believe patient safe to discharge at this time.  All questions were answered.  Patient expressed agreement understanding of plan.  Differential diagnoses prior to evaluation: The emergent differential diagnosis includes, but is not  limited to, abscess, cellulitis, septic arthritis, dermoid cyst, avascular necrosis. This is not an exhaustive differential.   Past Medical History / Co-morbidities / Social History: BPPV, abscess  Lab Tests/Imaging studies: No labs are required for this visit.  POC ultrasound was done and showed a dermoid cyst.  Medications: I ordered medication including home Augmentin.  I have reviewed the patients home medicines and have made adjustments as  needed.    Disposition: After consideration of the diagnostic results and the patients response to treatment, I feel that the patient bit from discharge treatment as above.   emergency department workup does not suggest an emergent condition requiring admission or immediate intervention beyond what has been performed at this time. The plan is: Augmentin for cyst, follow-up with general surgery, return for new or worsening symptoms. The patient is safe for discharge and has been instructed to return immediately for worsening symptoms, change in symptoms or any other concerns.  Final Clinical Impression(s) / ED Diagnoses Final diagnoses:  Dermoid cyst    Rx / DC Orders ED Discharge Orders          Ordered    amoxicillin-clavulanate (AUGMENTIN) 875-125 MG tablet  Every 12 hours        07/27/23 0732              Lunette Stands, PA-C 07/27/23 0739    Anders Simmonds T, DO 07/27/23 [id number]

## 2023-07-27 NOTE — Discharge Instructions (Addendum)
 You were seen today for dermoid cyst of the right knee.  We are prescribing antibiotic to help decrease any inflammation which will also help decrease pain.  This antibiotic can cause stomach upset including nausea and diarrhea.  This is not a allergy this is a side effect of the medication.  You can help decrease the side effect by ingesting this with food.  Follow-up with general surgery for removal of the cyst in nonemergent setting.  I have attached information to the [facility].  Return to the ED for any new or worsening symptoms including fever, worsening swelling, chills, shortness of breath, chest pain.

## 2023-07-27 NOTE — ED Triage Notes (Signed)
 Pt reports having an abscess on his right knee for about 3 weeks. He endorses it has grown in size and is tender and hot to touch. Denies fever, chills, n/v/d. 7/10 pain. Pt able to ambulate without difficulty.

## 2023-08-19 ENCOUNTER — Other Ambulatory Visit: Payer: Self-pay | Admitting: Surgery

## 2023-08-19 DIAGNOSIS — R220 Localized swelling, mass and lump, head: Secondary | ICD-10-CM

## 2023-08-20 ENCOUNTER — Ambulatory Visit
Admission: RE | Admit: 2023-08-20 | Discharge: 2023-08-20 | Disposition: A | Discharge status: Home / Self Care | Source: Ambulatory Visit | Attending: Surgery | Admitting: Surgery

## 2023-08-20 DIAGNOSIS — R220 Localized swelling, mass and lump, head: Secondary | ICD-10-CM

## 2023-08-26 ENCOUNTER — Emergency Department
Admission: EM | Admit: 2023-08-26 | Discharge: 2023-08-26 | Disposition: A | Discharge status: Home / Self Care | Attending: Emergency Medicine | Admitting: Emergency Medicine

## 2023-08-26 ENCOUNTER — Other Ambulatory Visit: Payer: Self-pay

## 2023-08-26 ENCOUNTER — Encounter: Payer: Self-pay | Admitting: *Deleted

## 2023-08-26 DIAGNOSIS — S61012A Laceration without foreign body of left thumb without damage to nail, initial encounter: Secondary | ICD-10-CM | POA: Insufficient documentation

## 2023-08-26 DIAGNOSIS — Z23 Encounter for immunization: Secondary | ICD-10-CM | POA: Diagnosis not present

## 2023-08-26 DIAGNOSIS — W268XXA Contact with other sharp object(s), not elsewhere classified, initial encounter: Secondary | ICD-10-CM | POA: Diagnosis not present

## 2023-08-26 DIAGNOSIS — Y9389 Activity, other specified: Secondary | ICD-10-CM | POA: Diagnosis not present

## 2023-08-26 DIAGNOSIS — S6992XA Unspecified injury of left wrist, hand and finger(s), initial encounter: Secondary | ICD-10-CM | POA: Diagnosis present

## 2023-08-26 MED ORDER — TETANUS-DIPHTH-ACELL PERTUSSIS 5-2.5-18.5 LF-MCG/0.5 IM SUSY
0.5000 mL | PREFILLED_SYRINGE | Freq: Once | INTRAMUSCULAR | Status: AC
  Administered 2023-08-26: 0.5 mL via INTRAMUSCULAR
  Filled 2023-08-26: qty 0.5

## 2023-08-26 MED ORDER — LIDOCAINE HCL (PF) 1 % IJ SOLN
5.0000 mL | Freq: Once | INTRAMUSCULAR | Status: AC
  Administered 2023-08-26: 5 mL
  Filled 2023-08-26: qty 5

## 2023-08-26 NOTE — ED Provider Notes (Signed)
 [place] EMERGENCY DEPARTMENT AT [facility] Provider Note   CSN: [id number] Arrival date & time: 08/26/23  1616     History  Chief Complaint  Patient presents with   Laceration    Grove L Dulworth is a 43 y.o. male.  Presents to the emergency department valuation of laceration to the left thumb.  Patient suffered a laceration earlier today when he was taking a large branch and trying to break it by hitting it up against a tree.  The larger branch broke, and a sharp fragment cut the tip of his left thumb.  He does not feel as if he injured his bones.  He states he only has burning pain along the soft tissue.  He is get some bleeding.  His tetanus status is unknown.  HPI     Home Medications Prior to Admission medications   Medication Sig Start Date End Date Taking? Authorizing Provider  amoxicillin -clavulanate (AUGMENTIN ) 875-125 MG tablet Take 1 tablet by mouth every 12 (twelve) hours. 07/27/23   Bauer, Collin S, PA-C      Allergies    Shrimp [shellfish allergy]    Review of Systems   Review of Systems  Physical Exam Updated Vital Signs BP (!) 138/94 (BP Location: Left Arm)   Pulse 64   Temp 98.2 F (36.8 C) (Oral)   Resp 19   Ht 6' (1.829 m)   Wt 81.6 kg   SpO2 100%   BMI 24.41 kg/m  Physical Exam Constitutional:      Appearance: He is well-developed.  HENT:     Head: Normocephalic and atraumatic.  Eyes:     Conjunctiva/sclera: Conjunctivae normal.  Cardiovascular:     Rate and Rhythm: Normal rate.  Pulmonary:     Effort: Pulmonary effort is normal. No respiratory distress.  Musculoskeletal:        General: Normal range of motion.     Cervical back: Normal range of motion.     Comments: Left thumb with no swelling warmth or erythema.  There is a vertical laceration starting at the distal nail along the ulnar sided nail fold of the thumb.  Laceration extends halfway to the proximal nail fold.  No bleeding underneath the nail, no visible or  palpable foreign body.  Laceration is well-approximated.  No tendon deficits noted  Skin:    General: Skin is warm.     Findings: No rash.  Neurological:     General: No focal deficit present.     Mental Status: He is alert and oriented to person, place, and time.  Psychiatric:        Behavior: Behavior normal.        Thought Content: Thought content normal.     ED Results / Procedures / Treatments   Labs (all labs ordered are listed, but only abnormal results are displayed) Labs Reviewed - No data to display  EKG None  Radiology No results found.  Procedures .Laceration Repair  Date/Time: 08/26/2023 9:32 PM  Performed by: Coralyn Derry, PA-C Authorized by: Coralyn Derry, PA-C   Consent:    Consent obtained:  Verbal   Consent given by:  Patient Universal protocol:    Patient identity confirmed:  Verbally with patient Anesthesia:    Anesthesia method:  Nerve block   Block needle gauge:  25 G   Block anesthetic:  Lidocaine  1% w/o epi   Block technique:  Digital block thumb   Block injection procedure:  Introduced needle and negative aspiration  for blood   Block outcome:  Anesthesia achieved Laceration details:    Length (cm):  1 Exploration:    Limited defect created (wound extended): no     Contaminated: no   Treatment:    Area cleansed with:  Povidone-iodine    Amount of cleaning:  Standard   Irrigation solution:  Sterile saline   Irrigation method:  Pressure wash   Debridement:  None Skin repair:    Repair method:  Tissue adhesive Repair type:    Repair type:  Simple Post-procedure details:    Dressing:  Adhesive bandage     Medications Ordered in ED Medications  Tdap (BOOSTRIX) injection 0.5 mL (0.5 mLs Intramuscular Given 08/26/23 1852)  lidocaine  (PF) (XYLOCAINE ) 1 % injection 5 mL (5 mLs Infiltration Given by Other 08/26/23 1853)    ED Course/ Medical Decision Making/ A&P                                 Medical Decision  Making Risk Prescription drug management.   43 year old male with left thumb laceration.  No visible or palpable foreign body.  Digital block was performed so that I could visualize the laceration to rule out any foreign body.  No foreign body seen or palpated.  We elected not to proceed with x-rays due to no bony tenderness and no risk for metallic foreign body.  No concern for fracture based on physical exam and history.  Wound was thoroughly irrigated and as well as soaked in Betadine  and saline and derma bond and Band-Aid applied.  He is educated on wound care and signs symptoms return to the ED for. Final Clinical Impression(s) / ED Diagnoses Final diagnoses:  Laceration of left thumb without foreign body without damage to nail, initial encounter    Rx / DC Orders ED Discharge Orders     None         Orysia Blas 08/26/23 2134    Marylynn Soho, MD 08/26/23 2206

## 2023-08-26 NOTE — ED Notes (Signed)
 Pt verbalized he was upset that the PA was taking so long to suture his laceration. This RN apologized to him for the wait.

## 2023-08-26 NOTE — ED Notes (Signed)
 PA is now at bedside suturing his wound

## 2023-08-26 NOTE — Discharge Instructions (Signed)
 Please keep laceration site/glue clean covered and dry for 5 days.  After 5 days you can leave uncovered and get wet.  Return for any swelling warmth redness or drainage.  You may take Tylenol  and ibuprofen  as needed for pain.

## 2023-08-26 NOTE — ED Triage Notes (Signed)
 Pt ambulatory to triage  pt has laceration to left thumb.  Pt cut thumb on a tree today.  Bleeding controlled  pt alert  speech clear.

## 2024-02-18 ENCOUNTER — Other Ambulatory Visit: Payer: Self-pay

## 2024-02-18 ENCOUNTER — Emergency Department
Admission: EM | Admit: 2024-02-18 | Discharge: 2024-02-18 | Disposition: A | Discharge status: Home / Self Care | Attending: Emergency Medicine | Admitting: Emergency Medicine

## 2024-02-18 ENCOUNTER — Emergency Department

## 2024-02-18 DIAGNOSIS — S50311A Abrasion of right elbow, initial encounter: Secondary | ICD-10-CM | POA: Diagnosis not present

## 2024-02-18 DIAGNOSIS — W19XXXA Unspecified fall, initial encounter: Secondary | ICD-10-CM | POA: Insufficient documentation

## 2024-02-18 DIAGNOSIS — M25521 Pain in right elbow: Secondary | ICD-10-CM

## 2024-02-18 DIAGNOSIS — S59901A Unspecified injury of right elbow, initial encounter: Secondary | ICD-10-CM | POA: Diagnosis present

## 2024-02-18 DIAGNOSIS — T148XXA Other injury of unspecified body region, initial encounter: Secondary | ICD-10-CM

## 2024-02-18 DIAGNOSIS — Y9383 Activity, rough housing and horseplay: Secondary | ICD-10-CM | POA: Diagnosis not present

## 2024-02-18 NOTE — Discharge Instructions (Signed)
 Keep area clean with soap and water .  Cover with a Band-Aid as needed.  Change bandage twice daily.  Apply ice over the area as needed.

## 2024-02-18 NOTE — ED Provider Notes (Signed)
 [facility] Emergency Department Provider Note     Event Date/Time   First MD Initiated Contact with Patient 02/18/24 1013     (approximate)   History   Laceration   HPI  Alex Quinn is a 43 y.o. male with no significant past medical history presents to the ED for evaluation of right elbow pain.  Patient reports he was roughhousing with a family member at his home yesterday and landed on his right elbow.  There is a noted abrasion to the area.  Denies decreased motion.  No other complaint.     Physical Exam   Triage Vital Signs: ED Triage Vitals  Encounter Vitals Group     BP 02/18/24 0958 (!) 145/81     Girls Systolic BP Percentile --      Girls Diastolic BP Percentile --      Boys Systolic BP Percentile --      Boys Diastolic BP Percentile --      Pulse Rate 02/18/24 0958 66     Resp 02/18/24 0958 18     Temp 02/18/24 0958 98.5 F (36.9 C)     Temp Source 02/18/24 0958 Oral     SpO2 02/18/24 0958 99 %     Weight 02/18/24 1012 180 lb 12.4 oz (82 kg)     Height 02/18/24 1012 6' (1.829 m)     Head Circumference --      Peak Flow --      Pain Score --      Pain Loc --      Pain Education --      Exclude from Growth Chart --     Most recent vital signs: Vitals:   02/18/24 0958  BP: (!) 145/81  Pulse: 66  Resp: 18  Temp: 98.5 F (36.9 C)  SpO2: 99%    General Awake, no distress.  HEENT NCAT.  CV:  Good peripheral perfusion.  RESP:  Normal effort.  ABD:  No distention.  Other:  Right elbow reveals no visible deformity.  There is a dime sized abrasion to posterior aspect.  F ROM.  Neurovascular status intact all throughout.   ED Results / Procedures / Treatments   Labs (all labs ordered are listed, but only abnormal results are displayed) Labs Reviewed - No data to display RADIOLOGY  I personally viewed and evaluated these images as part of my medical decision making, as well as reviewing the written report by the  radiologist.  ED Provider Interpretation: No acute bony abnormality to right elbow.  DG Elbow Complete Right Result Date: 02/18/2024 CLINICAL DATA:  Fall with pain in the right elbow. EXAM: RIGHT ELBOW - COMPLETE 3+ VIEW COMPARISON:  None Available. FINDINGS: There is no evidence of fracture, dislocation, or joint effusion. There is no evidence of arthropathy or other focal bone abnormality. Soft tissues are unremarkable. IMPRESSION: Negative. Electronically Signed   By: Camellia Candle M.D.   On: 02/18/2024 10:27    PROCEDURES:  Critical Care performed: No  Procedures  MEDICATIONS ORDERED IN ED: Medications - No data to display  IMPRESSION / MDM / ASSESSMENT AND PLAN / ED COURSE  I reviewed the triage vital signs and the nursing notes.                                 43 y.o. male presents to the emergency department for evaluation and treatment of  acute right elbow injury. See HPI for further details.   Differential diagnosis includes, but is not limited to fracture, abrasion, contusion, hematoma, laceration  Patient's presentation is most consistent with acute complicated illness / injury requiring diagnostic workup.  Patient is alert and oriented.  He is hemodynamically stable.  Physical exam findings are as stated above.  Right elbow x-ray reassuring.  Presentation consistent with abrasion to right elbow likely overlying contusion due to MOI.  No indication for suture repair given superficial nature.  Area irrigated and cleaned with chlorhexidine .  Band-Aid placed.  Wound care education provided.  Patient stable condition for discharge home.  ED return precaution discussed.  FINAL CLINICAL IMPRESSION(S) / ED DIAGNOSES   Final diagnoses:  Abrasion  Elbow pain, right   Rx / DC Orders   ED Discharge Orders     None      Note:  This document was prepared using Dragon voice recognition software and may include unintentional dictation errors.    Margrette, Bronte Sabado A,  PA-C 02/18/24 1111    Willo Dunnings, MD 02/18/24 [phone]

## 2024-02-18 NOTE — ED Triage Notes (Signed)
 Patient states yesterday he fell and now has abrasion and pain to right elbow.

## 2024-02-18 NOTE — ED Notes (Signed)
 See triage note  Presents s/p fall  States fell yesterday  Having pain to right elbow  Abrasion noted  Good pulses
